# Patient Record
Sex: Male | Born: 1995 | Race: White | Hispanic: No | Marital: Single | Smoking: Current every day smoker
Health system: Southern US, Community
[De-identification: ages and names within clinical notes are randomized; demographics above are authoritative.]

## PROBLEM LIST (undated history)

## (undated) ENCOUNTER — Ambulatory Visit: Admission: EM | Payer: Worker's Compensation | Source: Home / Self Care

## (undated) DIAGNOSIS — J45909 Unspecified asthma, uncomplicated: Secondary | ICD-10-CM

## (undated) DIAGNOSIS — G589 Mononeuropathy, unspecified: Secondary | ICD-10-CM

## (undated) HISTORY — PX: SKIN GRAFT: SHX250

---
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
## 2013-01-27 ENCOUNTER — Emergency Department: Payer: Self-pay | Admitting: Emergency Medicine

## 2013-12-13 ENCOUNTER — Emergency Department: Payer: Self-pay | Admitting: Emergency Medicine

## 2014-02-04 ENCOUNTER — Emergency Department: Payer: Self-pay | Admitting: Emergency Medicine

## 2016-01-30 ENCOUNTER — Encounter: Payer: Self-pay | Admitting: Emergency Medicine

## 2016-01-30 ENCOUNTER — Emergency Department
Admission: EM | Admit: 2016-01-30 | Discharge: 2016-01-30 | Disposition: A | Discharge status: Home / Self Care | Payer: Self-pay | Attending: Student in an Organized Health Care Education/Training Program | Admitting: Student in an Organized Health Care Education/Training Program

## 2016-01-30 DIAGNOSIS — G8929 Other chronic pain: Secondary | ICD-10-CM | POA: Insufficient documentation

## 2016-01-30 DIAGNOSIS — F17200 Nicotine dependence, unspecified, uncomplicated: Secondary | ICD-10-CM | POA: Insufficient documentation

## 2016-01-30 DIAGNOSIS — K032 Erosion of teeth: Secondary | ICD-10-CM | POA: Insufficient documentation

## 2016-01-30 DIAGNOSIS — K0889 Other specified disorders of teeth and supporting structures: Secondary | ICD-10-CM

## 2016-01-30 DIAGNOSIS — K029 Dental caries, unspecified: Secondary | ICD-10-CM | POA: Insufficient documentation

## 2016-01-30 MED ORDER — MAGIC MOUTHWASH W/LIDOCAINE: 5.0000 mL | Freq: Four times a day (QID) | ORAL | 0 refills | Status: DC

## 2016-01-30 NOTE — ED Provider Notes (Signed)
[facility] Emergency Department Provider Note  ____________________________________________  Time seen: Approximately 8:19 PM  I have reviewed the triage vital signs and the nursing notes.   HISTORY  Chief Complaint Dental Pain    HPI Joshua PughJames C Gruetzmacher is a 20 y.o. male who presents to emergency department complaining of chronic left molar pain. Patient states that he has seen multiple dental clinics were unable to extract tooth. He saw a "tooth specialist"who told him it was going to be $900 to extract the tooth. Patient states that he does not have the money. Patient is concerned that he may have an infection in the site at this time. He denies any fevers or chills, difficulty breathing or swallowing.   History reviewed. No pertinent past medical history.  There are no active problems to display for this patient.   Past Surgical History:  Procedure Laterality Date  . SKIN GRAFT      Prior to Admission medications   Medication Sig Start Date End Date Taking? Authorizing Provider  magic mouthwash w/lidocaine SOLN Take 5 mLs by mouth 4 (four) times daily. 01/30/16   Delorise RoyalsJonathan D Cuthriell, PA-C    Allergies Review of patient's allergies indicates no known allergies.  No family history on file.  Social History Social History  Substance Use Topics  . Smoking status: Current Every Day Smoker  . Smokeless tobacco: Never Used  . Alcohol use No     Review of Systems  Constitutional: No fever/chills Eyes: No visual changes. No discharge ENT: Positive for left upper dental pain Cardiovascular: no chest pain. Respiratory: no cough. No SOB. Gastrointestinal: No abdominal pain.  No nausea, no vomiting.  Musculoskeletal: Negative for musculoskeletal pain. Skin: Negative for rash, abrasions, lacerations, ecchymosis. Neurological: Negative for headaches, focal weakness or numbness. 10-point ROS otherwise  negative.  ____________________________________________   PHYSICAL EXAM:  VITAL SIGNS: ED Triage Vitals  Enc Vitals Group     BP 01/30/16 2016 (!) 154/105     Pulse Rate 01/30/16 2016 99     Resp 01/30/16 2016 16     Temp 01/30/16 2016 98.2 F (36.8 C)     Temp Source 01/30/16 2016 Oral     SpO2 01/30/16 2016 100 %     Weight 01/30/16 2017 160 lb (72.6 kg)     Height 01/30/16 2017 5\' 6"  (1.676 m)     Head Circumference --      Peak Flow --      Pain Score 01/30/16 2017 5     Pain Loc --      Pain Edu? --      Excl. in GC? --      Constitutional: Alert and oriented. Well appearing and in no acute distress. Eyes: Conjunctivae are normal. PERRL. EOMI. Head: Atraumatic. ENT:      Ears:       Nose: No congestion/rhinnorhea.      Mouth/Throat: Mucous membranes are moist. Patient has poor dentition throughout. Tooth #14 has eroded into the pulp. No signs of infection. Area is nontender to palpation with tongue depressor. Neck: No stridor.   Hematological/Lymphatic/Immunilogical: No cervical lymphadenopathy. Cardiovascular: Normal rate, regular rhythm. Normal S1 and S2.  Good peripheral circulation. Respiratory: Normal respiratory effort without tachypnea or retractions. Lungs CTAB. Good air entry to the bases with no decreased or absent breath sounds. Musculoskeletal: Full range of motion to all extremities. No gross deformities appreciated. Neurologic:  Normal speech and language. No gross focal neurologic deficits are appreciated.  Skin:  Skin is warm, dry and intact. No rash noted. Psychiatric: Mood and affect are normal. Speech and behavior are normal. Patient exhibits appropriate insight and judgement.   ____________________________________________   LABS (all labs ordered are listed, but only abnormal results are displayed)  Labs Reviewed - No data to  display ____________________________________________  EKG   ____________________________________________  RADIOLOGY   No results found.  ____________________________________________    PROCEDURES  Procedure(s) performed:    Procedures    Medications - No data to display   ____________________________________________   INITIAL IMPRESSION / ASSESSMENT AND PLAN / ED COURSE  Pertinent labs & imaging results that were available during my care of the patient were reviewed by me and considered in my medical decision making (see chart for details).  Clinical Course    Patient's diagnosis is consistent with Dental pain. Patient has extensive dental erosions and cavities.. Patient will be discharged home with prescriptions for Magic mouthwash for symptom control. Patient is to follow up with [facility] dental clinic. Patient is given ED precautions to return to the ED for any worsening or new symptoms.     ____________________________________________  FINAL CLINICAL IMPRESSION(S) / ED DIAGNOSES  Final diagnoses:  Pain, dental  Dental caries  Dental erosion, generalized      NEW MEDICATIONS STARTED DURING THIS VISIT:  New Prescriptions   MAGIC MOUTHWASH W/LIDOCAINE SOLN    Take 5 mLs by mouth 4 (four) times daily.        This chart was dictated using voice recognition software/Dragon. Despite best efforts to proofread, errors can occur which can change the meaning. Any change was purely unintentional.    Racheal Patches, PA-C 01/30/16 2036    Willy Eddy, MD 01/30/16 2045

## 2016-01-30 NOTE — ED Triage Notes (Signed)
Per andrea, rn. Pt with left upper molar pain for greater than a year.

## 2016-03-18 ENCOUNTER — Emergency Department: Payer: No Typology Code available for payment source

## 2016-03-18 ENCOUNTER — Emergency Department
Admission: EM | Admit: 2016-03-18 | Discharge: 2016-03-18 | Disposition: A | Discharge status: Home / Self Care | Payer: No Typology Code available for payment source | Attending: Student | Admitting: Student

## 2016-03-18 ENCOUNTER — Encounter: Payer: Self-pay | Admitting: Emergency Medicine

## 2016-03-18 DIAGNOSIS — M546 Pain in thoracic spine: Secondary | ICD-10-CM

## 2016-03-18 DIAGNOSIS — Y9384 Activity, sleeping: Secondary | ICD-10-CM | POA: Diagnosis not present

## 2016-03-18 DIAGNOSIS — Y92410 Unspecified street and highway as the place of occurrence of the external cause: Secondary | ICD-10-CM | POA: Insufficient documentation

## 2016-03-18 DIAGNOSIS — J45909 Unspecified asthma, uncomplicated: Secondary | ICD-10-CM | POA: Insufficient documentation

## 2016-03-18 DIAGNOSIS — Y999 Unspecified external cause status: Secondary | ICD-10-CM | POA: Insufficient documentation

## 2016-03-18 DIAGNOSIS — S39012A Strain of muscle, fascia and tendon of lower back, initial encounter: Secondary | ICD-10-CM | POA: Diagnosis not present

## 2016-03-18 DIAGNOSIS — F17210 Nicotine dependence, cigarettes, uncomplicated: Secondary | ICD-10-CM | POA: Diagnosis not present

## 2016-03-18 DIAGNOSIS — M545 Low back pain: Secondary | ICD-10-CM | POA: Diagnosis present

## 2016-03-18 HISTORY — DX: Unspecified asthma, uncomplicated: J45.909

## 2016-03-18 HISTORY — DX: Mononeuropathy, unspecified: G58.9

## 2016-03-18 MED ORDER — IBUPROFEN 800 MG PO TABS: 800.0000 mg | ORAL_TABLET | Freq: Three times a day (TID) | ORAL | 0 refills | Status: DC | PRN

## 2016-03-18 MED ORDER — KETOROLAC TROMETHAMINE 60 MG/2ML IM SOLN
60.0000 mg | Freq: Once | INTRAMUSCULAR | Status: AC
  Administered 2016-03-18: 60 mg via INTRAMUSCULAR
  Filled 2016-03-18: qty 2

## 2016-03-18 MED ORDER — METHOCARBAMOL 750 MG PO TABS: 750.0000 mg | ORAL_TABLET | Freq: Four times a day (QID) | ORAL | 0 refills | Status: DC
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
## 2016-03-18 NOTE — ED Notes (Signed)
Pt verbalized understanding of discharge instructions. NAD at this time. 
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
## 2016-08-08 ENCOUNTER — Emergency Department
Admission: EM | Admit: 2016-08-08 | Discharge: 2016-08-08 | Disposition: A | Discharge status: Home / Self Care | Payer: Self-pay | Attending: Emergency Medicine | Admitting: Emergency Medicine

## 2016-08-08 ENCOUNTER — Encounter: Payer: Self-pay | Admitting: Medical Oncology

## 2016-08-08 DIAGNOSIS — J039 Acute tonsillitis, unspecified: Secondary | ICD-10-CM | POA: Insufficient documentation

## 2016-08-08 DIAGNOSIS — J45909 Unspecified asthma, uncomplicated: Secondary | ICD-10-CM | POA: Insufficient documentation

## 2016-08-08 DIAGNOSIS — F17210 Nicotine dependence, cigarettes, uncomplicated: Secondary | ICD-10-CM | POA: Insufficient documentation

## 2016-08-08 LAB — POCT RAPID STREP A: STREPTOCOCCUS, GROUP A SCREEN (DIRECT): NEGATIVE

## 2016-08-08 MED ORDER — LIDOCAINE VISCOUS 2 % MT SOLN: 5.0000 mL | Freq: Four times a day (QID) | OROMUCOSAL | 0 refills | Status: DC | PRN

## 2016-08-08 MED ORDER — AMOXICILLIN 500 MG PO CAPS: 500.0000 mg | ORAL_CAPSULE | Freq: Three times a day (TID) | ORAL | 0 refills | Status: DC

## 2016-08-08 MED ORDER — FIRST-DUKES MOUTHWASH MT SUSP: 10.0000 mL | Freq: Four times a day (QID) | OROMUCOSAL | 0 refills | Status: DC

## 2016-08-08 NOTE — ED Provider Notes (Signed)
[facility] Emergency Department Provider Note   ____________________________________________   First MD Initiated Contact with Patient 08/08/16 1211     (approximate)  I have reviewed the triage vital signs and the nursing notes.   HISTORY  Chief Complaint Fever and Sore Throat    HPI Joshua Riggs is a 21 y.o. male patient complaining of sore throat and fever began last night. Patient rates his pain as a 4/10.Patient states pain increases with swallowing but able to tolerate food and fluids. No palliative measures taken for this complaint.   Past Medical History:  Diagnosis Date  . Asthma   . Pinched nerve    back    There are no active problems to display for this patient.   Past Surgical History:  Procedure Laterality Date  . SKIN GRAFT      Prior to Admission medications   Medication Sig Start Date End Date Taking? Authorizing Provider  amoxicillin (AMOXIL) 500 MG capsule Take 1 capsule (500 mg total) by mouth 3 (three) times daily. 08/08/16   Joni Reiningonald K Smith, PA-C  Diphenhyd-Hydrocort-Nystatin (FIRST-DUKES MOUTHWASH) SUSP Use as directed 10 mLs in the mouth or throat 4 (four) times daily. 08/08/16   Joni Reiningonald K Smith, PA-C  ibuprofen (ADVIL,MOTRIN) 800 MG tablet Take 1 tablet (800 mg total) by mouth every 8 (eight) hours as needed. 03/18/16   Charmayne Sheerharles M Beers, PA-C  lidocaine (XYLOCAINE) 2 % solution Use as directed 5 mLs in the mouth or throat every 6 (six) hours as needed for mouth pain. Mixed with mouthwash swish and swallow. 08/08/16   Joni Reiningonald K Smith, PA-C  methocarbamol (ROBAXIN) 750 MG tablet Take 1 tablet (750 mg total) by mouth 4 (four) times daily. 03/18/16   Evangeline Dakinharles M Beers, PA-C    Allergies Patient has no known allergies.  No family history on file.  Social History Social History  Substance Use Topics  . Smoking status: Current Every Day Smoker    Packs/day: 1.00    Types: Cigarettes  . Smokeless tobacco: Never Used  .  Alcohol use Yes     Comment: occasionally    Review of Systems Constitutional: No fever/chills Eyes: No visual changes. ENT: No throat  Cardiovascular: Denies chest pain. Respiratory: Denies shortness of breath. Gastrointestinal: No abdominal pain.  No nausea, no vomiting.  No diarrhea.  No constipation. Genitourinary: Negative for dysuria. Musculoskeletal: Negative for back pain. Skin: Negative for rash. Neurological: Negative for headaches, focal weakness or numbness.    ____________________________________________   PHYSICAL EXAM:  VITAL SIGNS: ED Triage Vitals  Enc Vitals Group     BP 08/08/16 1136 121/90     Pulse Rate 08/08/16 1135 99     Resp 08/08/16 1135 18     Temp 08/08/16 1135 98.4 F (36.9 C)     Temp Source 08/08/16 1135 Oral     SpO2 08/08/16 1135 98 %     Weight 08/08/16 1135 160 lb (72.6 kg)     Height 08/08/16 1135 5\' 8"  (1.727 m)     Head Circumference --      Peak Flow --      Pain Score 08/08/16 1136 10     Pain Loc --      Pain Edu? --      Excl. in GC? --     Constitutional: Alert and oriented. Well appearing and in no acute distress. Eyes: Conjunctivae are normal. PERRL. EOMI. Head: Atraumatic. Nose: No congestion/rhinnorhea. Mouth/Throat: Mucous membranes are moist.  Oropharynx erythematous.Edematous tonsils. Neck: No stridor.  No cervical spine tenderness to palpation. Hematological/Lymphatic/Immunilogical: Bilateral cervical lymphadenopathy. Cardiovascular: Normal rate, regular rhythm. Grossly normal heart sounds.  Good peripheral circulation. Respiratory: Normal respiratory effort.  No retractions. Lungs CTAB. Gastrointestinal: Soft and nontender. No distention. No abdominal bruits. No CVA tenderness. Musculoskeletal: No lower extremity tenderness nor edema.  No joint effusions. Neurologic:  Normal speech and language. No gross focal neurologic deficits are appreciated. No gait instability. Skin:  Skin is warm, dry and intact. No rash  noted. Psychiatric: Mood and affect are normal. Speech and behavior are normal.  ____________________________________________   LABS (all labs ordered are listed, but only abnormal results are displayed)  Labs Reviewed  POCT RAPID STREP A   ____________________________________________  EKG   ____________________________________________  RADIOLOGY   ____________________________________________   PROCEDURES  Procedure(s) performed: None  Procedures  Critical Care performed: No  ____________________________________________   INITIAL IMPRESSION / ASSESSMENT AND PLAN / ED COURSE  Pertinent labs & imaging results that were available during my care of the patient were reviewed by me and considered in my medical decision making (see chart for details).  Tonsillitis. Patient given discharge care instructions. Patient given a prescription for amoxicillin, didn't mouthwash, and viscous lidocaine. Patient advised follow-up with "clinic if condition persists.      ____________________________________________   FINAL CLINICAL IMPRESSION(S) / ED DIAGNOSES  Final diagnoses:  Tonsillitis with exudate      NEW MEDICATIONS STARTED DURING THIS VISIT:  Discharge Medication List as of 08/08/2016  1:09 PM    START taking these medications   Details  amoxicillin (AMOXIL) 500 MG capsule Take 1 capsule (500 mg total) by mouth 3 (three) times daily., Starting Tue 08/08/2016, Print    Diphenhyd-Hydrocort-Nystatin (FIRST-DUKES MOUTHWASH) SUSP Use as directed 10 mLs in the mouth or throat 4 (four) times daily., Starting Tue 08/08/2016, Print    lidocaine (XYLOCAINE) 2 % solution Use as directed 5 mLs in the mouth or throat every 6 (six) hours as needed for mouth pain. Mixed with mouthwash swish and swallow., Starting Tue 08/08/2016, Print         Note:  This document was prepared using Dragon voice recognition software and may include unintentional dictation errors.    Joni Reining, PA-C 08/09/16 1233    Phineas Semen, MD 08/09/16 1247

## 2016-08-08 NOTE — ED Notes (Signed)
See triage note  Fever and sore throat which started yesterday  Afebrile on arrival

## 2016-08-08 NOTE — ED Notes (Signed)
Pt alert and oriented X4, active, cooperative, pt in NAD. RR even and unlabored, color WNL.  Pt informed to return if any life threatening symptoms occur.   

## 2016-08-08 NOTE — ED Triage Notes (Signed)
Pt reports sore throat and fever that began last night.  

## 2016-11-05 ENCOUNTER — Encounter: Payer: Self-pay | Admitting: Emergency Medicine

## 2016-11-05 ENCOUNTER — Emergency Department
Admission: EM | Admit: 2016-11-05 | Discharge: 2016-11-05 | Disposition: A | Discharge status: Home / Self Care | Payer: Self-pay | Attending: Emergency Medicine | Admitting: Emergency Medicine

## 2016-11-05 DIAGNOSIS — X58XXXA Exposure to other specified factors, initial encounter: Secondary | ICD-10-CM | POA: Insufficient documentation

## 2016-11-05 DIAGNOSIS — Y929 Unspecified place or not applicable: Secondary | ICD-10-CM | POA: Insufficient documentation

## 2016-11-05 DIAGNOSIS — F17210 Nicotine dependence, cigarettes, uncomplicated: Secondary | ICD-10-CM | POA: Insufficient documentation

## 2016-11-05 DIAGNOSIS — S93602A Unspecified sprain of left foot, initial encounter: Secondary | ICD-10-CM | POA: Insufficient documentation

## 2016-11-05 DIAGNOSIS — Y999 Unspecified external cause status: Secondary | ICD-10-CM | POA: Insufficient documentation

## 2016-11-05 DIAGNOSIS — Y939 Activity, unspecified: Secondary | ICD-10-CM | POA: Insufficient documentation

## 2016-11-05 DIAGNOSIS — J45909 Unspecified asthma, uncomplicated: Secondary | ICD-10-CM | POA: Insufficient documentation

## 2016-11-05 NOTE — ED Provider Notes (Signed)
[facility] Emergency Department Provider Note   ____________________________________________   First MD Initiated Contact with Patient 11/05/16 1620     (approximate)  I have reviewed the triage vital signs and the nursing notes.   HISTORY  Chief Complaint Ankle Injury    HPI Joshua Riggs is a 21 y.o. male patient complaining of dorsalleft foot pain for 2 days. Patient state unsure how he injured his foot/ankle but believe happened when he was moving something. Patient stated he was sent home from work on Thursday and told to have a return to work evaluation. Patient rates his foot /ankle pain as a 3/10. Patient described a pain as mild aching. Patient is able to bear weight. No palliative measures for complaint.   Past Medical History:  Diagnosis Date  . Asthma   . Pinched nerve    back    There are no active problems to display for this patient.   Past Surgical History:  Procedure Laterality Date  . SKIN GRAFT      Prior to Admission medications   Medication Sig Start Date End Date Taking? Authorizing Provider  amoxicillin (AMOXIL) 500 MG capsule Take 1 capsule (500 mg total) by mouth 3 (three) times daily. 08/08/16   Joni Reining, PA-C  Diphenhyd-Hydrocort-Nystatin (FIRST-DUKES MOUTHWASH) SUSP Use as directed 10 mLs in the mouth or throat 4 (four) times daily. 08/08/16   Joni Reining, PA-C  ibuprofen (ADVIL,MOTRIN) 800 MG tablet Take 1 tablet (800 mg total) by mouth every 8 (eight) hours as needed. 03/18/16   Beers, Charmayne Sheer, PA-C  lidocaine (XYLOCAINE) 2 % solution Use as directed 5 mLs in the mouth or throat every 6 (six) hours as needed for mouth pain. Mixed with mouthwash swish and swallow. 08/08/16   Joni Reining, PA-C  methocarbamol (ROBAXIN) 750 MG tablet Take 1 tablet (750 mg total) by mouth 4 (four) times daily. 03/18/16   Beers, Charmayne Sheer, PA-C    Allergies Patient has no known allergies.  No family history on  file.  Social History Social History  Substance Use Topics  . Smoking status: Current Every Day Smoker    Packs/day: 1.00    Types: Cigarettes  . Smokeless tobacco: Never Used  . Alcohol use Yes     Comment: occasionally    Review of Systems  Constitutional: No fever/chills Cardiovascular: Denies chest pain. Respiratory: Denies shortness of breath. Musculoskeletal:left dorsal foot pain. Skin: Negative for rash. Neurological: Negative for headaches, focal weakness or numbness.   ____________________________________________   PHYSICAL EXAM:  VITAL SIGNS: ED Triage Vitals  Enc Vitals Group     BP 11/05/16 1546 117/77     Pulse Rate 11/05/16 1546 88     Resp 11/05/16 1546 16     Temp 11/05/16 1546 98.1 F (36.7 C)     Temp Source 11/05/16 1546 Oral     SpO2 11/05/16 1546 99 %     Weight 11/05/16 1543 160 lb (72.6 kg)     Height 11/05/16 1543 5\' 7"  (1.702 m)     Head Circumference --      Peak Flow --      Pain Score 11/05/16 1543 3     Pain Loc --      Pain Edu? --      Excl. in GC? --     Constitutional: Alert and oriented. Well appearing and in no acute distress. Cardiovascular: Normal rate, regular rhythm. Grossly normal heart sounds.  Good  peripheral circulation. Respiratory: Normal respiratory effort.  No retractions. Lungs CTAB. Musculoskeletal: no obvious deformity edema or ecchymosis to the left foot. Patient has mild guarding palpation dorsal aspect of mid foot. Patient has normal gait. Neurologic:  Normal speech and language. No gross focal neurologic deficits are appreciated. No gait instability. Skin:  Skin is warm, dry and intact. No rash noted. Psychiatric: Mood and affect are normal. Speech and behavior are normal.  ____________________________________________   LABS (all labs ordered are listed, but only abnormal results are displayed)  Labs Reviewed - No data to  display ____________________________________________  EKG   ____________________________________________  RADIOLOGY   ____________________________________________   PROCEDURES  Procedure(s) performed: None  Procedures  Critical Care performed: No  ____________________________________________   INITIAL IMPRESSION / ASSESSMENT AND PLAN / ED COURSE  Pertinent labs & imaging results that were available during my care of the patient were reviewed by me and considered in my medical decision making (see chart for details).   left foot sprain. Patient given discharge care instructions and advised to follow "clinic if condition persists.      ____________________________________________   FINAL CLINICAL IMPRESSION(S) / ED DIAGNOSES  Final diagnoses:  Foot sprain, left, initial encounter      NEW MEDICATIONS STARTED DURING THIS VISIT:  New Prescriptions   No medications on file     Note:  This document was prepared using Dragon voice recognition software and may include unintentional dictation errors.    Joni ReiningSmith, Dagen Beevers K, PA-C 11/05/16 1627    Schaevitz, Myra Rudeavid Matthew, MD 11/05/16 [phone]

## 2016-11-05 NOTE — ED Notes (Signed)
Patient sprained his ankle last week and has been using RICE at home.  He presents with no swelling or ecchymosis.  He is able to fully bear weight on the "affected ankle" by doing lunges with no pain complaint or grimacing during effort.  He says he "is afraid when he goes back to work it will start to hurt"

## 2016-11-05 NOTE — ED Triage Notes (Signed)
States he injured ankle on Thursday "Doing something" and was sent home from work.  Here today to get a return to work note.

## 2017-05-17 ENCOUNTER — Emergency Department
Admission: EM | Admit: 2017-05-17 | Discharge: 2017-05-17 | Disposition: A | Discharge status: Home / Self Care | Payer: Self-pay | Attending: Emergency Medicine | Admitting: Emergency Medicine

## 2017-05-17 ENCOUNTER — Encounter: Payer: Self-pay | Admitting: Intensive Care

## 2017-05-17 DIAGNOSIS — F17210 Nicotine dependence, cigarettes, uncomplicated: Secondary | ICD-10-CM | POA: Insufficient documentation

## 2017-05-17 DIAGNOSIS — J039 Acute tonsillitis, unspecified: Secondary | ICD-10-CM | POA: Insufficient documentation

## 2017-05-17 DIAGNOSIS — J45909 Unspecified asthma, uncomplicated: Secondary | ICD-10-CM | POA: Insufficient documentation

## 2017-05-17 LAB — POCT RAPID STREP A: Streptococcus, Group A Screen (Direct): NEGATIVE

## 2017-05-17 MED ORDER — LIDOCAINE VISCOUS 2 % MT SOLN: 10.0000 mL | OROMUCOSAL | 0 refills | Status: DC | PRN

## 2017-05-17 MED ORDER — DEXAMETHASONE SODIUM PHOSPHATE 10 MG/ML IJ SOLN
10.0000 mg | Freq: Once | INTRAMUSCULAR | Status: AC
  Administered 2017-05-17: 10 mg via INTRAMUSCULAR
  Filled 2017-05-17: qty 1

## 2017-05-17 MED ORDER — AMOXICILLIN 500 MG PO CAPS: 500.0000 mg | ORAL_CAPSULE | Freq: Two times a day (BID) | ORAL | 0 refills | Status: AC

## 2017-05-17 NOTE — ED Notes (Signed)

## 2017-05-17 NOTE — ED Triage Notes (Signed)
Patient reports he has had a sore throat X2 days and has noticeable white spots on tonsils. HX strep. Reports no fever today

## 2017-05-17 NOTE — ED Provider Notes (Signed)
[facility] Emergency Department Provider Note  ____________________________________________  Time seen: Approximately 7:06 PM  I have reviewed the triage vital signs and the nursing notes.   HISTORY  Chief Complaint Sore Throat    HPI Joshua Riggs is a 21 y.o. male that presents to the emergency department for evaluation of sore throat for 2 days.  Patient states that he gets strep throat frequently and this feels the same.  He also has nasal congestion and can only breathe through one nostril.  He thinks that he has had a fever on and off but has not checked his temperature.  No sick contacts.  He states that he has not taken any medication for symptoms because he was waiting for the emergency department to give him medicines.  No cough, shortness breath, nausea, vomiting, abdominal pain.  Past Medical History:  Diagnosis Date  . Asthma   . Pinched nerve    back    There are no active problems to display for this patient.   Past Surgical History:  Procedure Laterality Date  . SKIN GRAFT      Prior to Admission medications   Medication Sig Start Date End Date Taking? Authorizing Provider  amoxicillin (AMOXIL) 500 MG capsule Take 1 capsule (500 mg total) by mouth 2 (two) times daily for 10 days. 05/17/17 05/27/17  Enid DerryWagner, Houa Nie, PA-C  Diphenhyd-Hydrocort-Nystatin (FIRST-DUKES MOUTHWASH) SUSP Use as directed 10 mLs in the mouth or throat 4 (four) times daily. 08/08/16   Joni ReiningSmith, Ronald K, PA-C  ibuprofen (ADVIL,MOTRIN) 800 MG tablet Take 1 tablet (800 mg total) by mouth every 8 (eight) hours as needed. 03/18/16   Beers, Charmayne Sheerharles M, PA-C  lidocaine (XYLOCAINE) 2 % solution Use as directed 10 mLs in the mouth or throat as needed for mouth pain. 05/17/17   Enid DerryWagner, Tommie Bohlken, PA-C  methocarbamol (ROBAXIN) 750 MG tablet Take 1 tablet (750 mg total) by mouth 4 (four) times daily. 03/18/16   Beers, Charmayne Sheerharles M, PA-C    Allergies Patient has no known  allergies.  History reviewed. No pertinent family history.  Social History Social History   Tobacco Use  . Smoking status: Current Every Day Smoker    Packs/day: 1.00    Types: Cigarettes  . Smokeless tobacco: Never Used  Substance Use Topics  . Alcohol use: Yes    Comment: occasionally  . Drug use: No     Review of Systems  Eyes: No visual changes. No discharge. ENT: Positive for congestion and rhinorrhea. Cardiovascular: No chest pain. Respiratory: Negative for cough. No SOB. Gastrointestinal: No abdominal pain.  No nausea, no vomiting.  No diarrhea.  No constipation. Musculoskeletal: Negative for musculoskeletal pain. Skin: Negative for rash, abrasions, lacerations, ecchymosis. Neurological: Negative for headaches.   ____________________________________________   PHYSICAL EXAM:  VITAL SIGNS: ED Triage Vitals  Enc Vitals Group     BP 05/17/17 1806 137/82     Pulse Rate 05/17/17 1806 71     Resp 05/17/17 1806 16     Temp 05/17/17 1806 (!) 97.5 F (36.4 C)     Temp Source 05/17/17 1806 Oral     SpO2 05/17/17 1806 98 %     Weight 05/17/17 1807 145 lb (65.8 kg)     Height 05/17/17 1807 5\' 6"  (1.676 m)     Head Circumference --      Peak Flow --      Pain Score --      Pain Loc --  Pain Edu? --      Excl. in GC? --      Constitutional: Alert and oriented. Well appearing and in no acute distress. Eyes: Conjunctivae are normal. PERRL. EOMI. No discharge. Head: Atraumatic. ENT: No frontal and maxillary sinus tenderness.      Ears: Tympanic membranes pearly gray with good landmarks. No discharge.      Nose: Mild congestion/rhinnorhea.      Mouth/Throat: Mucous membranes are moist. Oropharynx erythematous. Tonsils enlarged 2+ bilaterally with exudates. Uvula midline. Neck: No stridor.   Hematological/Lymphatic/Immunilogical: No cervical lymphadenopathy. Cardiovascular: Normal rate, regular rhythm.  Good peripheral circulation. Respiratory: Normal  respiratory effort without tachypnea or retractions. Lungs CTAB. Good air entry to the bases with no decreased or absent breath sounds. Gastrointestinal: Bowel sounds 4 quadrants. Soft and nontender to palpation. No guarding or rigidity. No palpable masses. No distention. Musculoskeletal: Full range of motion to all extremities. No gross deformities appreciated. Neurologic:  Normal speech and language. No gross focal neurologic deficits are appreciated.  Skin:  Skin is warm, dry and intact. No rash noted.  ____________________________________________   LABS (all labs ordered are listed, but only abnormal results are displayed)  Labs Reviewed  POCT RAPID STREP A   ____________________________________________  EKG   ____________________________________________  RADIOLOGY   No results found.  ____________________________________________    PROCEDURES  Procedure(s) performed:    Procedures    Medications  dexamethasone (DECADRON) injection 10 mg (10 mg Intramuscular Given 05/17/17 1908)     ____________________________________________   INITIAL IMPRESSION / ASSESSMENT AND PLAN / ED COURSE  Pertinent labs & imaging results that were available during my care of the patient were reviewed by me and considered in my medical decision making (see chart for details).  Review of the [place] CSRS was performed in accordance of the [facility] prior to dispensing any controlled drugs.   Patient's diagnosis is consistent with tonsillitis. Vital signs and exam are reassuring. Patient appears well and is staying well hydrated. Patient feels comfortable going home. He was given IM decadron in ED. Patient will be discharged home with prescriptions for amoxicillin and viscous lidocaine. Patient is to follow up with PCP as needed or otherwise directed. Patient is given ED precautions to return to the ED for any worsening or new  symptoms.     ____________________________________________  FINAL CLINICAL IMPRESSION(S) / ED DIAGNOSES  Final diagnoses:  Tonsillitis          This chart was dictated using voice recognition software/Dragon. Despite best efforts to proofread, errors can occur which can change the meaning. Any change was purely unintentional.   Enid DerryWagner, Audrey Eller, PA-C 05/17/17 2106  Arnaldo NatalMalinda, Paul F, MD 05/17/17 2337

## 2017-10-01 ENCOUNTER — Emergency Department
Admission: EM | Admit: 2017-10-01 | Discharge: 2017-10-01 | Disposition: A | Discharge status: Home / Self Care | Payer: Self-pay | Attending: Emergency Medicine | Admitting: Emergency Medicine

## 2017-10-01 ENCOUNTER — Encounter: Payer: Self-pay | Admitting: Emergency Medicine

## 2017-10-01 ENCOUNTER — Emergency Department: Payer: Self-pay

## 2017-10-01 DIAGNOSIS — F17210 Nicotine dependence, cigarettes, uncomplicated: Secondary | ICD-10-CM | POA: Insufficient documentation

## 2017-10-01 DIAGNOSIS — J45909 Unspecified asthma, uncomplicated: Secondary | ICD-10-CM | POA: Insufficient documentation

## 2017-10-01 DIAGNOSIS — J069 Acute upper respiratory infection, unspecified: Secondary | ICD-10-CM | POA: Insufficient documentation

## 2017-10-01 MED ORDER — AMOXICILLIN 500 MG PO CAPS: 500.0000 mg | ORAL_CAPSULE | Freq: Three times a day (TID) | ORAL | 0 refills | Status: DC

## 2017-10-01 MED ORDER — BENZONATATE 200 MG PO CAPS: 200.0000 mg | ORAL_CAPSULE | Freq: Three times a day (TID) | ORAL | 0 refills | Status: AC | PRN

## 2017-10-01 NOTE — ED Triage Notes (Signed)
Pt reports his brother was diagnosed with bronchitis and he drank after him and now with nasal congestion, coughing and sore throat.

## 2017-10-01 NOTE — ED Notes (Signed)
Pt verbalizes understanding of d/c instructions, medications and follow up 

## 2017-10-01 NOTE — ED Provider Notes (Signed)
[facility] Emergency Department Provider Note  ____________________________________________   First MD Initiated Contact with Patient 10/01/17 [phone]     (approximate)  I have reviewed the triage vital signs and the nursing notes.   HISTORY  Chief Complaint Nasal Congestion; Sore Throat; and Cough    HPI Joshua Riggs is a 22 y.o. male presents emergency department complaining of cough and congestion with fever for 2 days.  He states his brother was diagnosed with bronchitis last week and had same symptoms.  He denies any vomiting or diarrhea.  Denies chest pain or shortness of breath.  Past Medical History:  Diagnosis Date  . Asthma   . Pinched nerve    back    There are no active problems to display for this patient.   Past Surgical History:  Procedure Laterality Date  . SKIN GRAFT      Prior to Admission medications   Medication Sig Start Date End Date Taking? Authorizing Provider  amoxicillin (AMOXIL) 500 MG capsule Take 1 capsule (500 mg total) by mouth 3 (three) times daily. 10/01/17   Fisher, Roselyn Bering, PA-C  benzonatate (TESSALON) 200 MG capsule Take 1 capsule (200 mg total) by mouth 3 (three) times daily as needed for cough. 10/01/17   Faythe Ghee, PA-C    Allergies Patient has no known allergies.  No family history on file.  Social History Social History   Tobacco Use  . Smoking status: Current Every Day Smoker    Packs/day: 1.00    Types: Cigarettes  . Smokeless tobacco: Never Used  Substance Use Topics  . Alcohol use: Yes    Comment: occasionally  . Drug use: No    Review of Systems  Constitutional: Positive fever/chills Eyes: No visual changes. ENT: No sore throat. Respiratory:  positive cough Genitourinary: Negative for dysuria. Musculoskeletal: Negative for back pain. Skin: Negative for rash.    ____________________________________________   PHYSICAL EXAM:  VITAL SIGNS: ED Triage Vitals  Enc Vitals  Group     BP 10/01/17 0900 122/84     Pulse Rate 10/01/17 0900 (!) 108     Resp 10/01/17 0900 20     Temp 10/01/17 0900 98.3 F (36.8 C)     Temp Source 10/01/17 0900 Oral     SpO2 10/01/17 0900 99 %     Weight 10/01/17 0901 140 lb (63.5 kg)     Height 10/01/17 0901 5\' 7"  (1.702 m)     Head Circumference --      Peak Flow --      Pain Score 10/01/17 0901 10     Pain Loc --      Pain Edu? --      Excl. in GC? --     Constitutional: Alert and oriented. Well appearing and in no acute distress. Eyes: Conjunctivae are normal.  Head: Atraumatic. Nose: No congestion/rhinnorhea. Mouth/Throat: Mucous membranes are moist.  Throat is red and irritated Neck: Is supple, no lymphadenopathy is noted Cardiovascular: Normal rate, regular rhythm.  Heart sounds are normal Respiratory: Normal respiratory effort.  No retractions, lungs clear to auscultation GU: deferred Musculoskeletal: FROM all extremities, warm and well perfused Neurologic:  Normal speech and language.  Skin:  Skin is warm, dry and intact. No rash noted. Psychiatric: Mood and affect are normal. Speech and behavior are normal.  ____________________________________________   LABS (all labs ordered are listed, but only abnormal results are displayed)  Labs Reviewed - No data to display ____________________________________________  ____________________________________________  RADIOLOGY  Chest x-ray is negative for any acute abnormality  ____________________________________________   PROCEDURES  Procedure(s) performed: No  Procedures    ____________________________________________   INITIAL IMPRESSION / ASSESSMENT AND PLAN / ED COURSE  Pertinent labs & imaging results that were available during my care of the patient were reviewed by me and considered in my medical decision making (see chart for details).  Patient is 22 year old male presents to the emergency department with flulike symptoms.  On  physical exam he appears well.  Lungs clear to auscultation.  There is no other findings other than his throat is red and irritated.  His cough is dry and hacking  Chest x-ray is negative for any acute abnormality  Chest diagnoses acute upper respiratory infection.  He was given a prescription for amoxicillin 500 mg 3 times a day for 10 days.  Tessalon Perles 200 mg 3 times a day for cough.  He is to drink plenty of fluids.  Take Tylenol and ibuprofen.  He was given a work note for today and tomorrow as he was to start a new job today.  He was discharged in stable condition.     As part of my medical decision making, I reviewed the following data within the electronic MEDICAL RECORD NUMBER Nursing notes reviewed and incorporated, Old chart reviewed, Radiograph reviewed chest x-ray is negative for any acute abnormality, Notes from prior ED visits and [place] Controlled Substance Database  ____________________________________________   FINAL CLINICAL IMPRESSION(S) / ED DIAGNOSES  Final diagnoses:  Acute upper respiratory infection      NEW MEDICATIONS STARTED DURING THIS VISIT:  Discharge Medication List as of 10/01/2017 10:12 AM    START taking these medications   Details  amoxicillin (AMOXIL) 500 MG capsule Take 1 capsule (500 mg total) by mouth 3 (three) times daily., Starting Mon 10/01/2017, Print    benzonatate (TESSALON) 200 MG capsule Take 1 capsule (200 mg total) by mouth 3 (three) times daily as needed for cough., Starting Mon 10/01/2017, Print         Note:  This document was prepared using Dragon voice recognition software and may include unintentional dictation errors.    Faythe GheeFisher, Susan W, PA-C 10/01/17 1858    Schaevitz, Myra Rudeavid Matthew, MD 10/02/17 [id number]

## 2017-10-01 NOTE — Discharge Instructions (Addendum)
Follow-up with your regular doctor if not better in 3-5 days.  Use medication as prescribed.  If you are becoming worsening return to the emergency department or follow-up the acute care.

## 2017-10-01 NOTE — ED Notes (Signed)
See triage note  States he developed body achess,fever and cough 2 days ago  Fever was subjective at home   Afebrile on arrival

## 2017-11-14 ENCOUNTER — Emergency Department: Payer: Self-pay

## 2017-11-14 ENCOUNTER — Encounter: Payer: Self-pay | Admitting: Emergency Medicine

## 2017-11-14 ENCOUNTER — Emergency Department
Admission: EM | Admit: 2017-11-14 | Discharge: 2017-11-15 | Disposition: A | Discharge status: Home / Self Care | Payer: Self-pay | Attending: Emergency Medicine | Admitting: Emergency Medicine

## 2017-11-14 DIAGNOSIS — Z5321 Procedure and treatment not carried out due to patient leaving prior to being seen by health care provider: Secondary | ICD-10-CM | POA: Insufficient documentation

## 2017-11-14 DIAGNOSIS — M25532 Pain in left wrist: Secondary | ICD-10-CM | POA: Insufficient documentation

## 2017-11-14 IMAGING — CR DG WRIST COMPLETE 3+V*L*
1 series · 4 of 4 positions shown · non-contrast
Comparison: None.

CLINICAL DATA: Acute onset of left wrist pain.  Initial encounter.

EXAM:
LEFT WRIST - COMPLETE 3+ VIEW

[Series 1: dg wrist complete left · 0.14mm/px · 4 of 4 slices shown]
[im 1/4]
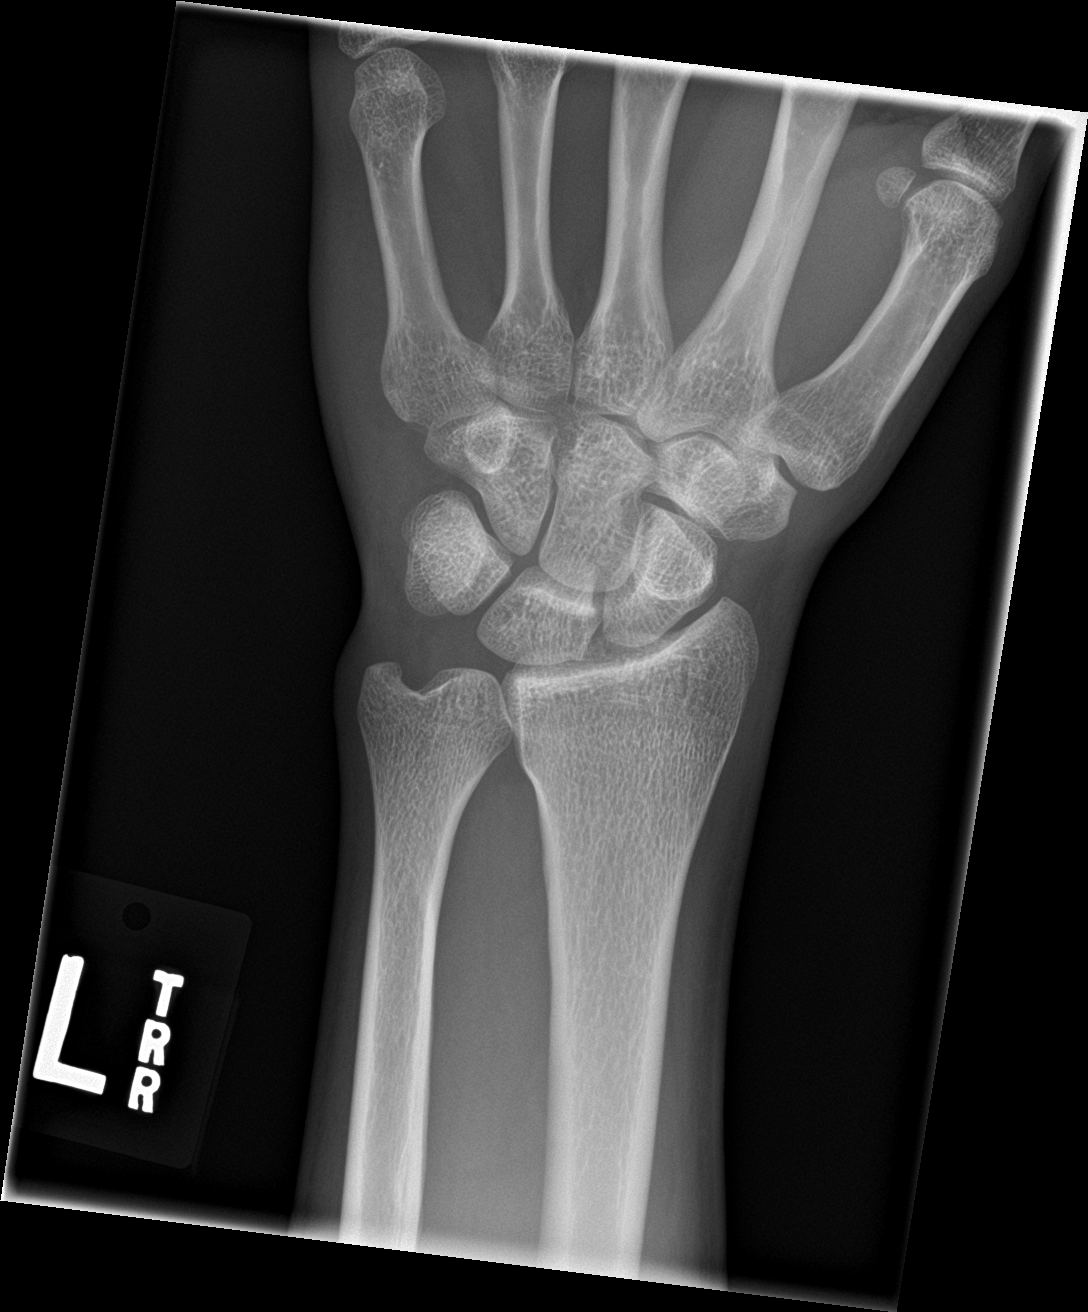
[im 2/4]
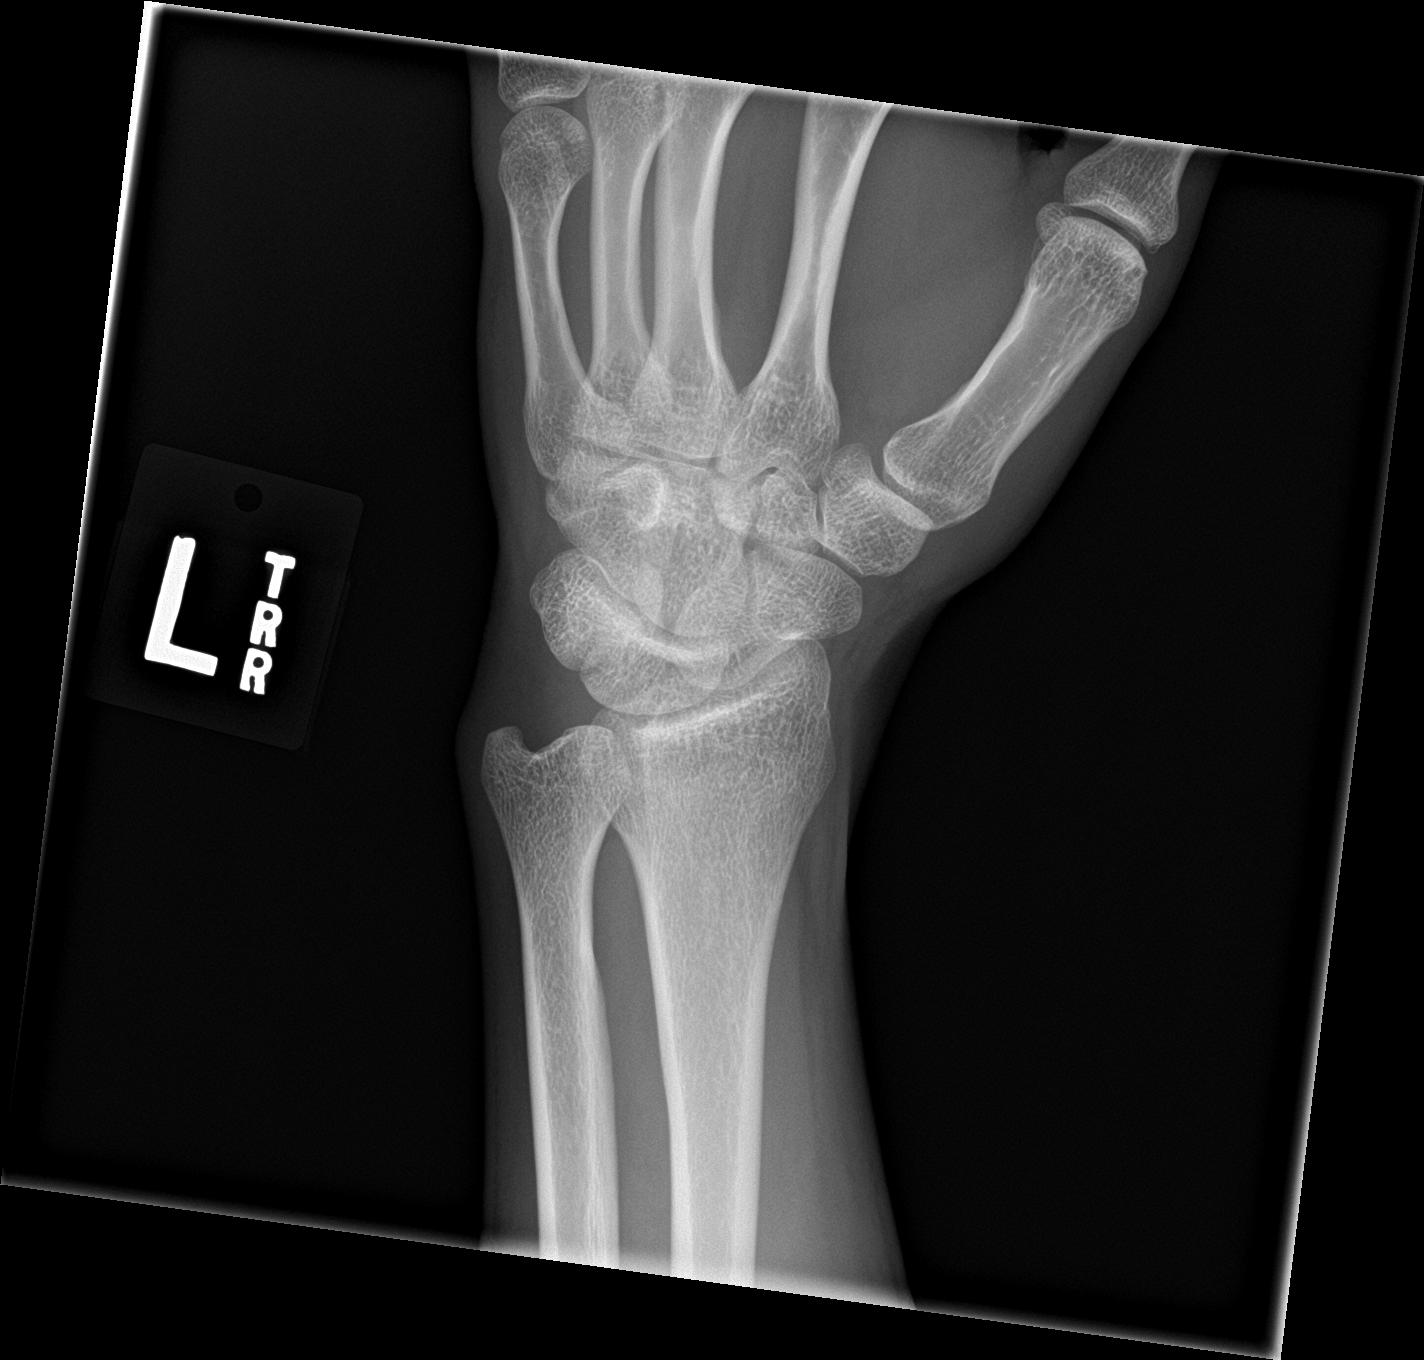
[im 3/4]
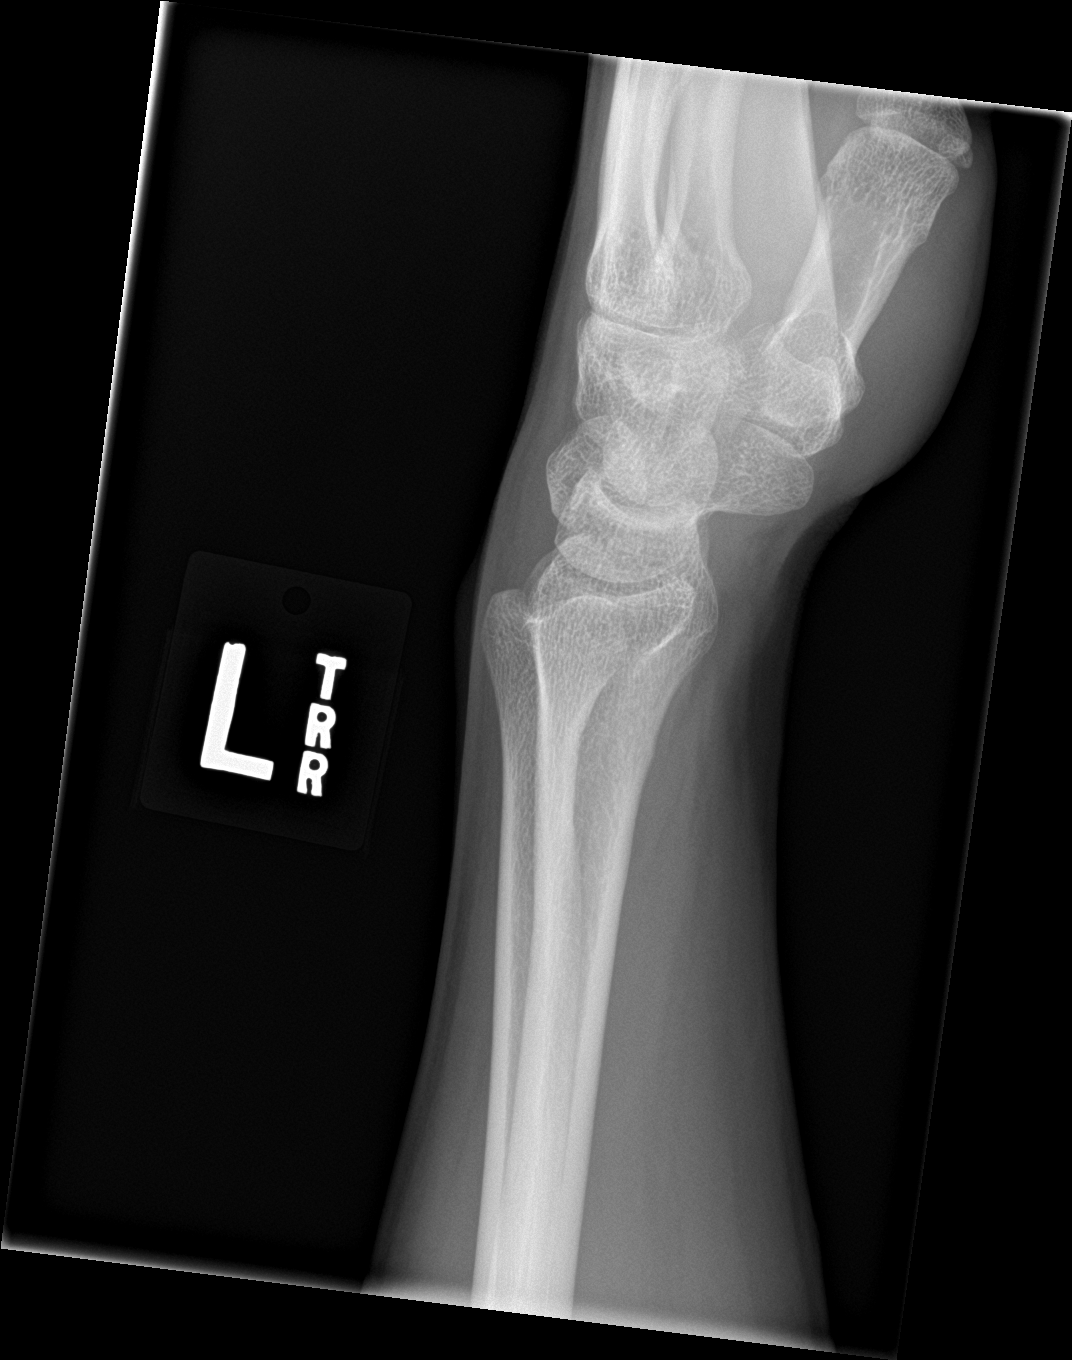
[im 4/4]
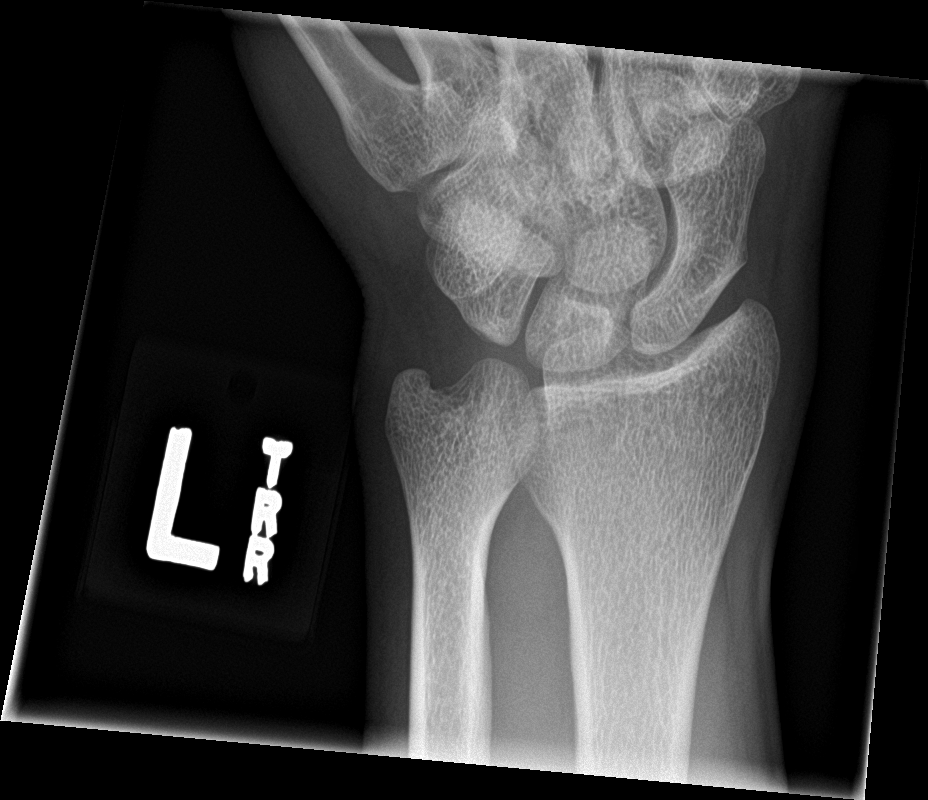

[4 of 4 positions shown; findings below may reference images not displayed]

FINDINGS: There is no evidence of fracture or dislocation. The carpal rows are
intact, and demonstrate normal alignment. The joint spaces are
preserved.

No significant soft tissue abnormalities are seen.
IMPRESSION: No evidence of fracture or dislocation.

## 2017-11-14 NOTE — ED Notes (Signed)
Patient called to be roomed x1.  No answer at this time.

## 2017-11-14 NOTE — ED Triage Notes (Signed)
Pt comes into the eD via POV c/o left wrist pain.  Patient denies any known injury to the wrist.  No deformity noted at this time and patient in NAD.

## 2017-11-14 NOTE — ED Notes (Signed)
Patient called to be roomed x2.  No answer at this time.

## 2018-02-12 ENCOUNTER — Encounter: Payer: Self-pay | Admitting: Emergency Medicine

## 2018-02-12 ENCOUNTER — Other Ambulatory Visit: Payer: Self-pay

## 2018-02-12 ENCOUNTER — Emergency Department
Admission: EM | Admit: 2018-02-12 | Discharge: 2018-02-12 | Disposition: A | Discharge status: Home / Self Care | Payer: Self-pay | Attending: Emergency Medicine | Admitting: Emergency Medicine

## 2018-02-12 DIAGNOSIS — K0889 Other specified disorders of teeth and supporting structures: Secondary | ICD-10-CM | POA: Insufficient documentation

## 2018-02-12 DIAGNOSIS — Z5321 Procedure and treatment not carried out due to patient leaving prior to being seen by health care provider: Secondary | ICD-10-CM | POA: Insufficient documentation

## 2018-02-12 NOTE — ED Notes (Signed)
Went to assess pt, pt not found in room. Registration overheard pt yelling on phone about the wait.

## 2018-02-12 NOTE — ED Triage Notes (Signed)
Patient to ER for c/o dental pain to left front upper tooth for last few days.

## 2019-05-16 ENCOUNTER — Emergency Department
Admission: EM | Admit: 2019-05-16 | Discharge: 2019-05-16 | Disposition: A | Discharge status: Home / Self Care | Payer: No Typology Code available for payment source | Attending: Emergency Medicine | Admitting: Emergency Medicine

## 2019-05-16 ENCOUNTER — Other Ambulatory Visit: Payer: Self-pay

## 2019-05-16 ENCOUNTER — Encounter: Payer: Self-pay | Admitting: Emergency Medicine

## 2019-05-16 ENCOUNTER — Emergency Department: Payer: No Typology Code available for payment source

## 2019-05-16 DIAGNOSIS — S161XXA Strain of muscle, fascia and tendon at neck level, initial encounter: Secondary | ICD-10-CM | POA: Diagnosis not present

## 2019-05-16 DIAGNOSIS — M25512 Pain in left shoulder: Secondary | ICD-10-CM | POA: Diagnosis not present

## 2019-05-16 DIAGNOSIS — F17210 Nicotine dependence, cigarettes, uncomplicated: Secondary | ICD-10-CM | POA: Insufficient documentation

## 2019-05-16 DIAGNOSIS — S199XXA Unspecified injury of neck, initial encounter: Secondary | ICD-10-CM | POA: Diagnosis present

## 2019-05-16 DIAGNOSIS — J45909 Unspecified asthma, uncomplicated: Secondary | ICD-10-CM | POA: Diagnosis not present

## 2019-05-16 DIAGNOSIS — Y998 Other external cause status: Secondary | ICD-10-CM | POA: Diagnosis not present

## 2019-05-16 DIAGNOSIS — Y9389 Activity, other specified: Secondary | ICD-10-CM | POA: Diagnosis not present

## 2019-05-16 DIAGNOSIS — Y92410 Unspecified street and highway as the place of occurrence of the external cause: Secondary | ICD-10-CM | POA: Insufficient documentation

## 2019-05-16 MED ORDER — NAPROXEN 500 MG PO TABS: 500.0000 mg | ORAL_TABLET | Freq: Two times a day (BID) | ORAL | 0 refills | Status: DC

## 2019-05-16 MED ORDER — CYCLOBENZAPRINE HCL 10 MG PO TABS: 10.0000 mg | ORAL_TABLET | Freq: Three times a day (TID) | ORAL | 0 refills | Status: AC | PRN
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
## 2019-05-16 NOTE — Discharge Instructions (Signed)
Please follow-up with your primary care provider or [facility] acute care for symptoms that are not improving over the week.  Return to the emergency department for symptoms that change or worsen if you are unable to schedule an appointment.  Do not drive if you are taking the muscle relaxer.
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
## 2019-06-06 ENCOUNTER — Emergency Department
Admission: EM | Admit: 2019-06-06 | Discharge: 2019-06-07 | Discharge status: Against Medical Advice | Payer: Self-pay | Attending: Emergency Medicine | Admitting: Emergency Medicine

## 2019-06-06 ENCOUNTER — Other Ambulatory Visit: Payer: Self-pay

## 2019-06-06 ENCOUNTER — Emergency Department
Admission: EM | Admit: 2019-06-06 | Discharge: 2019-06-06 | Disposition: A | Discharge status: Home / Self Care | Payer: Self-pay

## 2019-06-06 ENCOUNTER — Encounter: Payer: Self-pay | Admitting: Emergency Medicine

## 2019-06-06 ENCOUNTER — Emergency Department: Payer: Self-pay

## 2019-06-06 DIAGNOSIS — F17210 Nicotine dependence, cigarettes, uncomplicated: Secondary | ICD-10-CM | POA: Insufficient documentation

## 2019-06-06 DIAGNOSIS — R220 Localized swelling, mass and lump, head: Secondary | ICD-10-CM

## 2019-06-06 DIAGNOSIS — J45909 Unspecified asthma, uncomplicated: Secondary | ICD-10-CM | POA: Insufficient documentation

## 2019-06-06 DIAGNOSIS — K047 Periapical abscess without sinus: Secondary | ICD-10-CM | POA: Insufficient documentation

## 2019-06-06 DIAGNOSIS — Z79899 Other long term (current) drug therapy: Secondary | ICD-10-CM | POA: Insufficient documentation

## 2019-06-06 LAB — SALICYLATE LEVEL: Salicylate Lvl: <7 mg/dL (ref 2.8–30.0)

## 2019-06-06 LAB — CBC
HCT: 47.3 % (ref 39.0–52.0)
Hemoglobin: 17 g/dL (ref 13.0–17.0)
MCH: 31.7 pg (ref 26.0–34.0)
MCHC: 35.9 g/dL (ref 30.0–36.0)
MCV: 88.1 fL (ref 80.0–100.0)
Platelets: 327 10*3/uL (ref 150–400)
RBC: 5.37 MIL/uL (ref 4.22–5.81)
RDW: 12.3 % (ref 11.5–15.5)
WBC: 21.8 10*3/uL — ABNORMAL HIGH (ref 4.0–10.5)
nRBC: 0 % (ref 0.0–0.2)

## 2019-06-06 LAB — BASIC METABOLIC PANEL
Anion gap: 17 — ABNORMAL HIGH (ref 5–15)
BUN: 12 mg/dL (ref 6–20)
CO2: 23 mmol/L (ref 22–32)
Calcium: 9.9 mg/dL (ref 8.9–10.3)
Chloride: 97 mmol/L — ABNORMAL LOW (ref 98–111)
Creatinine, Ser: 0.92 mg/dL (ref 0.61–1.24)
GFR calc Af Amer: >60 mL/min (ref >60)
GFR calc non Af Amer: >60 mL/min (ref >60)
Glucose, Bld: 114 mg/dL — ABNORMAL HIGH (ref 70–99)
Potassium: 3.6 mmol/L (ref 3.5–5.1)
Sodium: 137 mmol/L (ref 135–145)

## 2019-06-06 LAB — LACTIC ACID, PLASMA: Lactic Acid, Venous: 1.4 mmol/L (ref 0.5–1.9)

## 2019-06-06 MED ORDER — HYDROMORPHONE HCL 1 MG/ML IJ SOLN
1.0000 mg | Freq: Once | INTRAMUSCULAR | Status: AC
  Administered 2019-06-06: 1 mg via INTRAVENOUS
  Filled 2019-06-06: qty 1

## 2019-06-06 MED ORDER — SODIUM CHLORIDE 0.9 % IV BOLUS
1000.0000 mL | Freq: Once | INTRAVENOUS | Status: AC
  Administered 2019-06-06: 1000 mL via INTRAVENOUS

## 2019-06-06 MED ORDER — SODIUM CHLORIDE 0.9 % IV SOLN
3.0000 g | Freq: Once | INTRAVENOUS | Status: AC
  Administered 2019-06-06: 3 g via INTRAVENOUS
  Filled 2019-06-06: qty 8

## 2019-06-06 MED ORDER — IOHEXOL 300 MG/ML  SOLN
75.0000 mL | Freq: Once | INTRAMUSCULAR | Status: AC | PRN
  Administered 2019-06-06: 75 mL via INTRAVENOUS
  Filled 2019-06-06: qty 75

## 2019-06-06 MED ORDER — ONDANSETRON HCL 4 MG/2ML IJ SOLN
4.0000 mg | Freq: Once | INTRAMUSCULAR | Status: AC
  Administered 2019-06-06: 4 mg via INTRAVENOUS
  Filled 2019-06-06: qty 2

## 2019-06-06 NOTE — ED Provider Notes (Signed)
[facility] EMERGENCY DEPARTMENT Provider Note   CSN: [id number] Arrival date & time: 06/06/19  2012     History Chief Complaint  Patient presents with  . Dental Pain    Joshua Riggs is a 23 y.o. male presents today for evaluation of right-sided facial pain, swelling.  Patient states yesterday he developed dental pain, facial swelling that has increased significantly from yesterday still today.  He has been taking Tylenol with no relief.  Pain is 10 out of 10.  Denies any purulent drainage intraorally.  Admits to dental decay right lower molars.  He has been able to tolerate p.o. well.  Does have slight pain with opening the jaw.  He denies any other symptoms.   After thorough discussion with patient he admits to taking 1 to 2 tablets of either aspirin or Tylenol every 2-3 hours as needed.  He also admits to have drinking 1/5 of whiskey in the last 2 days.  Typically does not drink alcohol, was only consuming alcohol for pain relief.  HPI     Past Medical History:  Diagnosis Date  . Asthma   . Pinched nerve    back    There are no problems to display for this patient.   Past Surgical History:  Procedure Laterality Date  . SKIN GRAFT         No family history on file.  Social History   Tobacco Use  . Smoking status: Current Every Day Smoker    Packs/day: 1.00    Types: Cigarettes  . Smokeless tobacco: Never Used  Substance Use Topics  . Alcohol use: Yes    Comment: occasionally  . Drug use: No    Home Medications Prior to Admission medications   Medication Sig Start Date End Date Taking? Authorizing Provider  amoxicillin (AMOXIL) 500 MG capsule Take 1 capsule (500 mg total) by mouth 3 (three) times daily. 10/01/17   Fisher, Roselyn Bering, PA-C  amoxicillin-clavulanate (AUGMENTIN) 875-125 MG tablet Take 1 tablet by mouth every 12 (twelve) hours for 10 days. 06/07/19 06/17/19  Evon Slack, PA-C  benzonatate (TESSALON) 200 MG capsule Take  1 capsule (200 mg total) by mouth 3 (three) times daily as needed for cough. 10/01/17   Fisher, Roselyn Bering, PA-C  cyclobenzaprine (FLEXERIL) 10 MG tablet Take 1 tablet (10 mg total) by mouth 3 (three) times daily as needed. 05/16/19   Triplett, Rulon Eisenmenger B, FNP  naproxen (NAPROSYN) 500 MG tablet Take 1 tablet (500 mg total) by mouth 2 (two) times daily with a meal. 05/16/19   Triplett, Cari B, FNP  oxyCODONE-acetaminophen (PERCOCET) 5-325 MG tablet Take 1 tablet by mouth every 4 (four) hours as needed for severe pain. 06/07/19 06/06/20  Evon Slack, PA-C    Allergies    Patient has no known allergies.  Review of Systems   Review of Systems  Constitutional: Positive for fever.  HENT: Negative for sinus pain, sore throat and trouble swallowing.   Respiratory: Negative for shortness of breath.   Cardiovascular: Negative for chest pain.  Gastrointestinal: Negative for nausea and vomiting.  Skin: Negative for wound.    Physical Exam Updated Vital Signs BP (!) 141/91 (BP Location: Right Arm)   Pulse (!) 114   Temp 99 F (37.2 C) (Oral)   Resp 19   SpO2 92%   Physical Exam Constitutional:      Appearance: He is well-developed.  HENT:     Head: Normocephalic.     Comments:  Right-sided facial swelling, right maxillary sinus all the way to the submandibular region.  No warmth or redness but moderate external tenderness.  No intraoral drainage.  No sublingual abnormalities.  Pharynx is normal no peritonsillar abscess. Eyes:     Conjunctiva/sclera: Conjunctivae normal.  Cardiovascular:     Rate and Rhythm: Normal rate.  Pulmonary:     Effort: Pulmonary effort is normal. No respiratory distress.  Musculoskeletal:        General: Normal range of motion.     Cervical back: Normal range of motion.  Skin:    General: Skin is warm.     Findings: No rash.  Neurological:     Mental Status: He is alert and oriented to person, place, and time.  Psychiatric:        Behavior: Behavior normal.         Thought Content: Thought content normal.     ED Results / Procedures / Treatments   Labs (all labs ordered are listed, but only abnormal results are displayed) Labs Reviewed  CBC - Abnormal; Notable for the following components:      Result Value   WBC 21.8 (*)    All other components within normal limits  BASIC METABOLIC PANEL - Abnormal; Notable for the following components:   Chloride 97 (*)    Glucose, Bld 114 (*)    Anion gap 17 (*)    All other components within normal limits  HEPATIC FUNCTION PANEL - Abnormal; Notable for the following components:   Total Bilirubin 1.4 (*)    Bilirubin, Direct 0.3 (*)    Indirect Bilirubin 1.1 (*)    All other components within normal limits  CULTURE, BLOOD (SINGLE)  LACTIC ACID, PLASMA  SALICYLATE LEVEL  LACTIC ACID, PLASMA    EKG None  Radiology CT Maxillofacial W Contrast  Result Date: 06/06/2019 CLINICAL DATA:  Initial evaluation for acute right facial pain and swelling. EXAM: CT MAXILLOFACIAL WITH CONTRAST TECHNIQUE: Multidetector CT imaging of the maxillofacial structures was performed with intravenous contrast. Multiplanar CT image reconstructions were also generated. CONTRAST:  87mL OMNIPAQUE IOHEXOL 300 MG/ML  SOLN COMPARISON:  None available. FINDINGS: Osseous: No acute osseous abnormality seen within the face. No discrete osseous lesions. Orbits: Globes and orbital soft tissues within normal limits. No evidence for intraorbital or postseptal cellulitis. Sinuses: Mild to moderate mucosal thickening seen about the right maxillary sinus. More mild mucoperiosteal thickening seen about the ethmoidal air cells and right sphenoid sinus. Paranasal sinuses are otherwise clear. Mastoid air cells and middle ear cavities are well pneumatized and free of fluid. Soft tissues: Extensive/issue swelling with inflammatory stranding seen involving the right face, primarily involving the right masticator, parotid, and submandibular spaces, with  mild extension into the right parapharyngeal space. Findings consistent with acute infection/cellulitis. innumerable dental caries are present, most prominent of which seen at the right first mandibular molar. Findings suggest an odontogenic origin. No discrete abscess or drainable fluid collection seen at this time. Prominent right level I, IB, and II lymph nodes, likely reactive. Limited intracranial: Unremarkable. IMPRESSION: 1. Extensive swelling and inflammatory stranding throughout the right face as above, consistent with acute infection/cellulitis. Innumerable dental caries are present, suggesting an odontogenic origin. No discrete abscess or other drainable fluid collection identified. 2. Associated and/or concomitant mild right maxillary sinus disease. Electronically Signed   By: Jeannine Boga M.D.   On: 06/06/2019 23:58    Procedures Procedures (including critical care time)  Medications Ordered in ED Medications  HYDROmorphone (DILAUDID)  injection 1 mg (1 mg Intravenous Given 06/06/19 2155)  sodium chloride 0.9 % bolus 1,000 mL (0 mLs Intravenous Stopped 06/06/19 2318)  ondansetron (ZOFRAN) injection 4 mg (4 mg Intravenous Given 06/06/19 2153)  Ampicillin-Sulbactam (UNASYN) 3 g in sodium chloride 0.9 % 100 mL IVPB (3 g Intravenous New Bag/Given 06/06/19 2317)  iohexol (OMNIPAQUE) 300 MG/ML solution 75 mL (75 mLs Intravenous Contrast Given 06/06/19 2249)  sodium chloride 0.9 % bolus 1,000 mL (1,000 mLs Intravenous New Bag/Given 06/06/19 2314)    ED Course  I have reviewed the triage vital signs and the nursing notes.  Pertinent labs & imaging results that were available during my care of the patient were reviewed by me and considered in my medical decision making (see chart for details).    MDM Rules/Calculators/A&P                      23 year old male with 1 day history of right-sided facial swelling, dental pain.  Has numerous dental caries.  Patient with significant  leukocytosis of 21.8, CT showed no abscess formation, only extensive swelling.  Patient tolerating p.o. well.  Vital signs stable after IV fluids.  Heart rate down to 108.  Patient with no chest pain or shortness of breath.  Due to significant swelling, elevated white count I recommended patient stay in hospital for continued monitoring, fluids and IV antibiotics.  Patient absolutely refusing staying in the hospital despite understanding possible consequences.  Patient is given instructions on signs and symptoms to return to the ER immediately for such as any fevers above 101, increasing pain, increasing swelling, inability to tolerate p.o. fluids as well as no improvement of facial swelling in 24 hours.  Patient's given prescription for Augmentin and oxycodone.  He will discontinue Tylenol/aspirin/alcohol for pain control.   Final Clinical Impression(s) / ED Diagnoses Final diagnoses:  Dental abscess  Facial swelling    Rx / DC Orders ED Discharge Orders         Ordered    amoxicillin-clavulanate (AUGMENTIN) 875-125 MG tablet  Every 12 hours     06/07/19 0031    oxyCODONE-acetaminophen (PERCOCET) 5-325 MG tablet  Every 4 hours PRN     06/07/19 0031           Evon SlackGaines, Imanol Bihl C, PA-C 06/07/19 0041    Phineas SemenGoodman, Graydon, MD 06/09/19 1501

## 2019-06-06 NOTE — ED Notes (Signed)
Called with no response x2.

## 2019-06-06 NOTE — ED Notes (Signed)
Called with no response

## 2019-06-06 NOTE — ED Notes (Signed)
Called x3 with no response 

## 2019-06-07 LAB — HEPATIC FUNCTION PANEL
ALT: 26 U/L (ref 0–44)
AST: 27 U/L (ref 15–41)
Albumin: 4.3 g/dL (ref 3.5–5.0)
Alkaline Phosphatase: 84 U/L (ref 38–126)
Bilirubin, Direct: 0.3 mg/dL — ABNORMAL HIGH (ref 0.0–0.2)
Indirect Bilirubin: 1.1 mg/dL — ABNORMAL HIGH (ref 0.3–0.9)
Total Bilirubin: 1.4 mg/dL — ABNORMAL HIGH (ref 0.3–1.2)
Total Protein: 7.3 g/dL (ref 6.5–8.1)

## 2019-06-07 MED ORDER — OXYCODONE-ACETAMINOPHEN 5-325 MG PO TABS: 1.0000 | ORAL_TABLET | ORAL | 0 refills | Status: AC | PRN

## 2019-06-07 MED ORDER — AMOXICILLIN-POT CLAVULANATE 875-125 MG PO TABS: 1.0000 | ORAL_TABLET | Freq: Two times a day (BID) | ORAL | 0 refills | Status: AC

## 2019-06-07 NOTE — Discharge Instructions (Addendum)
Please return to the ER immediately for any increasing pain or swelling, difficulty swallowing, fevers above 101, worsening symptoms or urgent changes in health.

## 2019-06-11 LAB — CULTURE, BLOOD (SINGLE)
Culture: NO GROWTH
Special Requests: ADEQUATE

## 2020-08-08 ENCOUNTER — Other Ambulatory Visit: Payer: Self-pay

## 2020-08-08 ENCOUNTER — Emergency Department
Admission: EM | Admit: 2020-08-08 | Discharge: 2020-08-08 | Disposition: A | Discharge status: Home / Self Care | Payer: Self-pay | Attending: Student in an Organized Health Care Education/Training Program | Admitting: Student in an Organized Health Care Education/Training Program

## 2020-08-08 DIAGNOSIS — F17210 Nicotine dependence, cigarettes, uncomplicated: Secondary | ICD-10-CM | POA: Insufficient documentation

## 2020-08-08 DIAGNOSIS — K047 Periapical abscess without sinus: Secondary | ICD-10-CM | POA: Insufficient documentation

## 2020-08-08 DIAGNOSIS — J45909 Unspecified asthma, uncomplicated: Secondary | ICD-10-CM | POA: Insufficient documentation

## 2020-08-08 MED ORDER — AMOXICILLIN-POT CLAVULANATE 875-125 MG PO TABS
1.0000 | ORAL_TABLET | Freq: Once | ORAL | Status: AC
  Administered 2020-08-08: 1 via ORAL
  Filled 2020-08-08: qty 1

## 2020-08-08 MED ORDER — IBUPROFEN 800 MG PO TABS
800.0000 mg | ORAL_TABLET | Freq: Once | ORAL | Status: AC
  Administered 2020-08-08: 800 mg via ORAL
  Filled 2020-08-08: qty 1

## 2020-08-08 MED ORDER — AMOXICILLIN-POT CLAVULANATE 875-125 MG PO TABS: 1.0000 | ORAL_TABLET | Freq: Two times a day (BID) | ORAL | 0 refills | Status: AC

## 2020-08-08 MED ORDER — LIDOCAINE VISCOUS HCL 2 % MT SOLN
15.0000 mL | Freq: Once | OROMUCOSAL | Status: AC
  Administered 2020-08-08: 15 mL via OROMUCOSAL
  Filled 2020-08-08: qty 15

## 2020-08-08 MED ORDER — LIDOCAINE VISCOUS HCL 2 % MT SOLN: 15.0000 mL | OROMUCOSAL | 0 refills | Status: AC | PRN

## 2020-08-08 NOTE — ED Notes (Signed)
Patient reports pain and swelling to right lower jaw x 2 days. Patient reports missing teeth x several years. Patient reports dentist appointment scheduled for 3/2, but reports pain was too severe to wait.

## 2020-08-08 NOTE — ED Triage Notes (Signed)
Pt c/o swelling and pain to right lower jaw. Reports infected tooth. Denies fevers.

## 2020-08-08 NOTE — Discharge Instructions (Signed)
OPTIONS FOR DENTAL FOLLOW UP CARE ° °[place] [facility] - Local Safety Net Dental Clinics °http://www.ncdhhs.gov/dph/oralhealth/services/safetynetclinics.htm °  °[facility] (336-562-3123) ° °[facility] [place] ([phone]) ° °[facility] ([phone] ext [phone]) ° °[facility] ([phone]) ° °[facility] ([phone]) °This clinic caters to the indigent population and is on a lottery system. °Location: °[facility], [address], [place] °Clinic Hours: °Wednesdays from 6pm - 9pm, patients seen by a lottery system. °For dates, call or go to [email or web address].edu/[facility]/patients/Dental-[facility] °Services: °Cleanings, fillings and simple extractions. °Payment Options: °DENTAL WORK IS FREE OF CHARGE. Bring proof of income or support. °Best way to get seen: °Arrive at 5:15 pm - this is a lottery, NOT first come/first serve, so arriving earlier will not increase your chances of being seen. °  °  °[facility] °[phone] °Select option 1 for emergencies °  °Location: °[facility], [address], [place] °Clinic Hours: °No walk-ins accepted - call the day before to schedule an appointment. °Check in times are 9:30 am and 1:30 pm. °Services: °Simple extractions, temporary fillings, pulpectomy/pulp debridement, uncomplicated abscess drainage. °Payment Options: °PAYMENT IS DUE AT THE TIME OF SERVICE.  Fee is usually $100-200, additional surgical procedures (e.g. abscess drainage) may be extra. °Cash, checks, Visa/MasterCard accepted.  Can file Medicaid if patient is covered for dental - patient should call case worker to check. °No discount for [facility] patients. °Best way to get seen: °MUST call the day before and get onto the schedule. Can usually be seen the next 1-2 days. No walk-ins accepted. °  °  °[facility] °[phone] °   °Location: °[facility], [address], [place] °Clinic Hours: °M, W, Th, F 8am or 1:30pm, Tues 9a or 1:30 - first come/first served. °Services: °Simple extractions, temporary fillings, uncomplicated abscess drainage.  You do not need to be an [place] resident. °Payment Options: °PAYMENT IS DUE AT THE TIME OF SERVICE. °Dental insurance, otherwise sliding scale - bring proof of income or support. °Depending on income and treatment needed, cost is usually $50-200. °Best way to get seen: °Arrive early as it is first come/first served. °  °  °[facility] °[phone] °  °Location: °[address] °Clinic Hours: °Mon-Thu 8a-5p °Services: °Most basic dental services including extractions and fillings. °Payment Options: °PAYMENT IS DUE AT THE TIME OF SERVICE. °Sliding scale, up to 50% off - bring proof if income or support. °Medicaid with dental option accepted. °Best way to get seen: °Call to schedule an appointment, can usually be seen within 2 weeks OR they will try to see walk-ins - show up at 8a or 2p (you may have to wait). °  °  °[facility] °[phone] °[place] RESIDENTS ONLY °  °Location: °[facility], [address], [place], [place] [address] °Clinic Hours: By appointment only. °Monday - Thursday 8am-5pm, Friday 8am-12pm °Services: Cleanings, fillings, extractions. °Payment Options: °PAYMENT IS DUE AT THE TIME OF SERVICE. °Cash, Visa or MasterCard. Sliding scale - $30 minimum per service. °Best way to get seen: °Come in to office, complete packet and make an appointment - need proof of income °or support monies for each household member and proof of [place] residence. °Usually takes about a month to get in. °  °  °[facility] °[phone] °  °Location: °[address]   [place] °Clinic Hours: Walk-in Urgent Care Dental Services are offered Monday-Friday  mornings only. °The numbers of emergencies accepted daily is limited to the number of °providers available. °Maximum 15 - Mondays, Wednesdays & Thursdays °Maximum 10 - Tuesdays & Fridays °Services: °You do not need to be a [place] resident to be seen for a dental emergency. °Emergencies are defined as pain, swelling, abnormal bleeding, or dental trauma. Walkins will receive x-rays if needed. °NOTE: Dental cleaning is not an emergency. °Payment Options: °PAYMENT IS DUE AT THE TIME OF SERVICE. °Minimum co-pay is $40.00 for uninsured patients. °Minimum co-pay is $3.00 for Medicaid with dental coverage. °Dental Insurance is accepted and must be presented at time of visit. °Medicare does not cover dental. °Forms of payment: Cash, credit card, checks. °Best way to get seen: °If not previously registered with the clinic, walk-in dental registration begins at 7:15 am and is on a first come/first serve basis. °If previously registered with the clinic, call to make an appointment. °  °  °[facility] °[phone] °[place] RESIDENTS ONLY °  °Location: °[address], [place], [place] °Clinic Hours: °Mon-Thu 10a-2p °Services: Extractions only! °Payment Options: °FREE (donations accepted) - bring proof of income or support °Best way to get seen: °Call and schedule an appointment OR come at 8am on the 1st Monday of every month (except for holidays) when it is first come/first served. °  °  °[facility] °[phone] °  °Location: °[address], [place] °Clinic Hours: °Friday mornings °Services, Payment Options, Best way to get seen: °Call for info °

## 2020-08-10 NOTE — ED Provider Notes (Signed)
[facility] Emergency Department Provider Note  ____________________________________________   Event Date/Time   First MD Initiated Contact with Patient 08/08/20 1253     (approximate)  I have reviewed the triage vital signs and the nursing notes.   HISTORY  Chief Complaint Jaw Pain  HPI Joshua Riggs is a 25 y.o. male who presents to the emergency department for evaluation of right lower tooth pain that began while eating a piece of fried chicken 1 to 2 days ago.  Patient reports that since that time, he has had increasing pain of the right lower jaw with associated perceived jaw swelling.  He reports history of dental infections and abscesses.  He called to set up dental appointment for evaluation, but could not be seen until early March.  He denies any fever, facial erythema or other systemic symptoms.         Past Medical History:  Diagnosis Date  . Asthma   . Pinched nerve    back    There are no problems to display for this patient.   Past Surgical History:  Procedure Laterality Date  . SKIN GRAFT      Prior to Admission medications   Medication Sig Start Date End Date Taking? Authorizing Provider  amoxicillin-clavulanate (AUGMENTIN) 875-125 MG tablet Take 1 tablet by mouth 2 (two) times daily for 7 days. 08/08/20 08/15/20 Yes Reianna Batdorf, Ruben Gottron, PA  lidocaine (XYLOCAINE) 2 % solution Use as directed 15 mLs in the mouth or throat as needed for mouth pain. 08/08/20  Yes Lucy Chris, PA  benzonatate (TESSALON) 200 MG capsule Take 1 capsule (200 mg total) by mouth 3 (three) times daily as needed for cough. 10/01/17   Fisher, Roselyn Bering, PA-C  cyclobenzaprine (FLEXERIL) 10 MG tablet Take 1 tablet (10 mg total) by mouth 3 (three) times daily as needed. 05/16/19   Triplett, Rulon Eisenmenger B, FNP  naproxen (NAPROSYN) 500 MG tablet Take 1 tablet (500 mg total) by mouth 2 (two) times daily with a meal. 05/16/19   Triplett, Cari B, FNP    Allergies Patient  has no known allergies.  No family history on file.  Social History Social History   Tobacco Use  . Smoking status: Current Every Day Smoker    Packs/day: 1.00    Types: Cigarettes  . Smokeless tobacco: Never Used  Substance Use Topics  . Alcohol use: Yes    Comment: occasionally  . Drug use: No    Review of Systems  Constitutional: No fever/chills Eyes: No visual changes. ENT: + Dental pain, no sore throat. Cardiovascular: Denies chest pain. Respiratory: Denies shortness of breath. Gastrointestinal: No abdominal pain.  No nausea, no vomiting.  No diarrhea.  No constipation. Genitourinary: Negative for dysuria. Musculoskeletal: Negative for back pain. Skin: Negative for rash. Neurological: Negative for headaches, focal weakness or numbness.   ____________________________________________   PHYSICAL EXAM:  VITAL SIGNS: ED Triage Vitals  Enc Vitals Group     BP 08/08/20 1157 (!) 150/101     Pulse Rate 08/08/20 1157 (!) 119     Resp 08/08/20 1157 16     Temp 08/08/20 1157 97.6 F (36.4 C)     Temp Source 08/08/20 1347 Oral     SpO2 08/08/20 1157 99 %     Weight 08/08/20 1317 165 lb 5.5 oz (75 kg)     Height 08/08/20 1317 5\' 9"  (1.753 m)     Head Circumference --      Peak Flow --  Pain Score 08/08/20 1347 0     Pain Loc --      Pain Edu? --      Excl. in GC? --    Constitutional: Alert and oriented. Well appearing and in no acute distress. Eyes: Conjunctivae are normal. PERRL. EOMI. Head: Atraumatic. Nose: No congestion/rhinnorhea. Mouth/Throat: Mucous membranes are moist.  Extensive number of cavity lesions and broken teeth throughout the mouth.  On the right lower jaw, there is one broken tooth that is the site of significant pain for the patient.  Male with surrounding swelling of the gumline, no evidence of periapical abscess.  No surrounding facial swelling, though there is tenderness to palpation of the external right lower jaw. Neck: No stridor.    Lymphatic: No cervical lymphadenopathy Cardiovascular: Normal rate, regular rhythm. Grossly normal heart sounds.  Good peripheral circulation. Respiratory: Normal respiratory effort.  No retractions. Lungs CTAB. Neurologic:  Normal speech and language. No gross focal neurologic deficits are appreciated. No gait instability. Skin:  Skin is warm, dry and intact. No rash noted. Psychiatric: Mood and affect are normal. Speech and behavior are normal.   ____________________________________________   INITIAL IMPRESSION / ASSESSMENT AND PLAN / ED COURSE  As part of my medical decision making, I reviewed the following data within the electronic MEDICAL RECORD NUMBER Nursing notes reviewed and incorporated and Notes from prior ED visits        Patient is a 25 year old male who presents to the emergency department for evaluation of dental pain.  Patient has noted broken tooth as site of pain on right lower jawline.  He denies fevers and does not have any other systemic symptoms.  No overlying facial erythema or swelling suggestive of cellulitis.  Will initiate antibiotics, anti-inflammatory and provide topical lidocaine solution for the patient.  Advise close dental follow-up, which he is amenable with.  Advised to return to the emergency department with any acute worsening.  Patient stable at time for outpatient therapy.      ____________________________________________   FINAL CLINICAL IMPRESSION(S) / ED DIAGNOSES  Final diagnoses:  Dental infection     ED Discharge Orders         Ordered    amoxicillin-clavulanate (AUGMENTIN) 875-125 MG tablet  2 times daily        08/08/20 1418    lidocaine (XYLOCAINE) 2 % solution  As needed        08/08/20 1418          *Please note:  Joshua Riggs was evaluated in Emergency Department on 08/10/2020 for the symptoms described in the history of present illness. He was evaluated in the context of the global COVID-19 pandemic, which necessitated  consideration that the patient might be at risk for infection with the SARS-CoV-2 virus that causes COVID-19. Institutional protocols and algorithms that pertain to the evaluation of patients at risk for COVID-19 are in a state of rapid change based on information released by regulatory bodies including the CDC and federal and state organizations. These policies and algorithms were followed during the patient's care in the ED.  Some ED evaluations and interventions may be delayed as a result of limited staffing during and the pandemic.*   Note:  This document was prepared using Dragon voice recognition software and may include unintentional dictation errors.   Lucy Chris, PA 08/10/20 1518    Willy Eddy, MD 08/10/20 [phone]

## 2020-09-23 ENCOUNTER — Telehealth: Payer: Self-pay

## 2020-09-23 NOTE — Telephone Encounter (Signed)
After receiving an ER referral, tried to call pt. Unable to LVM.   MD 09/23/20 @ 2:51 pm

## 2021-03-20 ENCOUNTER — Other Ambulatory Visit: Payer: Self-pay

## 2021-03-20 ENCOUNTER — Emergency Department
Admission: EM | Admit: 2021-03-20 | Discharge: 2021-03-20 | Disposition: A | Discharge status: Home / Self Care | Payer: Self-pay | Attending: Emergency Medicine | Admitting: Emergency Medicine

## 2021-03-20 ENCOUNTER — Encounter: Payer: Self-pay | Admitting: Emergency Medicine

## 2021-03-20 DIAGNOSIS — J45909 Unspecified asthma, uncomplicated: Secondary | ICD-10-CM | POA: Insufficient documentation

## 2021-03-20 DIAGNOSIS — Z20822 Contact with and (suspected) exposure to covid-19: Secondary | ICD-10-CM

## 2021-03-20 DIAGNOSIS — U071 COVID-19: Secondary | ICD-10-CM | POA: Insufficient documentation

## 2021-03-20 DIAGNOSIS — F17210 Nicotine dependence, cigarettes, uncomplicated: Secondary | ICD-10-CM | POA: Insufficient documentation

## 2021-03-20 LAB — RESP PANEL BY RT-PCR (FLU A&B, COVID) ARPGX2
Influenza A by PCR: NEGATIVE
Influenza B by PCR: NEGATIVE
SARS Coronavirus 2 by RT PCR: POSITIVE — AB

## 2021-03-20 NOTE — Discharge Instructions (Addendum)
You are being tested for COVID and presumed to be positive based on your symptoms.  Remain out of work for 5 days until your symptoms resolve.  Return to the ED if symptoms worsen.

## 2021-03-20 NOTE — ED Provider Notes (Signed)
[facility] Emergency Department Provider Note ____________________________________________  Time seen: 36  I have reviewed the triage vital signs and the nursing notes.  HISTORY  Chief Complaint  Medical Clearance  HPI Joshua Riggs is a 25 y.o. male presents to the ED for evaluation of symptoms consistent with COVID.  Patient also reports that his employer is requesting a work note to allow him to return to work.  Patient reports that his dad and grandfather both have been diagnosed with COVID and they are his household contacts.  Patient began to have symptoms on Friday, and today reports he is nearly symptom-free.  He has not been tested for COVID and is not performed home test.  Past Medical History:  Diagnosis Date   Asthma    Pinched nerve    back    There are no problems to display for this patient.   Past Surgical History:  Procedure Laterality Date   SKIN GRAFT      Prior to Admission medications   Medication Sig Start Date End Date Taking? Authorizing Provider  benzonatate (TESSALON) 200 MG capsule Take 1 capsule (200 mg total) by mouth 3 (three) times daily as needed for cough. 10/01/17   Fisher, Roselyn Bering, PA-C  cyclobenzaprine (FLEXERIL) 10 MG tablet Take 1 tablet (10 mg total) by mouth 3 (three) times daily as needed. 05/16/19   Triplett, Cari B, FNP  lidocaine (XYLOCAINE) 2 % solution Use as directed 15 mLs in the mouth or throat as needed for mouth pain. 08/08/20   Lucy Chris, PA  naproxen (NAPROSYN) 500 MG tablet Take 1 tablet (500 mg total) by mouth 2 (two) times daily with a meal. 05/16/19   Triplett, Cari B, FNP    Allergies Patient has no known allergies.  No family history on file.  Social History Social History   Tobacco Use   Smoking status: Every Day    Packs/day: 1.00    Types: Cigarettes   Smokeless tobacco: Never  Substance Use Topics   Alcohol use: Yes    Comment: occasionally   Drug use: No    Review  of Systems  Constitutional: Negative for fever. Eyes: Negative for visual changes. ENT: Negative for sore throat. Cardiovascular: Negative for chest pain. Respiratory: Negative for shortness of breath. Gastrointestinal: Negative for abdominal pain, vomiting and diarrhea. Genitourinary: Negative for dysuria. Musculoskeletal: Negative for back pain. Skin: Negative for rash. Neurological: Negative for headaches, focal weakness or numbness. ____________________________________________  PHYSICAL EXAM:  VITAL SIGNS: ED Triage Vitals  Enc Vitals Group     BP 03/20/21 1818 (!) 155/92     Pulse Rate 03/20/21 1818 90     Resp 03/20/21 1818 18     Temp 03/20/21 1818 98.3 F (36.8 C)     Temp Source 03/20/21 1818 Oral     SpO2 03/20/21 1818 97 %     Weight 03/20/21 1816 170 lb (77.1 kg)     Height 03/20/21 1816 5\' 9"  (1.753 m)     Head Circumference --      Peak Flow --      Pain Score 03/20/21 1816 0     Pain Loc --      Pain Edu? --      Excl. in [place]? --     Constitutional: Alert and oriented. Well appearing and in no distress. Head: Normocephalic and atraumatic. Eyes: Conjunctivae are normal. Normal extraocular movements Cardiovascular: Normal rate, regular rhythm. Normal distal pulses. Respiratory: Normal respiratory  effort. No wheezes/rales/rhonchi. Gastrointestinal: Soft and nontender. No distention. Musculoskeletal: Nontender with normal range of motion in all extremities.  Neurologic:  Normal gait without ataxia. Normal speech and language. No gross focal neurologic deficits are appreciated. Skin:  Skin is warm, dry and intact. No rash noted. Psychiatric: Mood and affect are normal. Patient exhibits appropriate insight and judgment. ____________________________________________    {LABS (pertinent positives/negatives)  Labs Reviewed  RESP PANEL BY RT-PCR (FLU A&B, COVID) ARPGX2   ___________________________________________  {EKG  ____________________________________________   RADIOLOGY Official radiology report(s): No results found. ____________________________________________  PROCEDURES   Procedures ____________________________________________   INITIAL IMPRESSION / ASSESSMENT AND PLAN / ED COURSE  As part of my medical decision making, I reviewed the following data within the electronic MEDICAL RECORD NUMBER Labs reviewed pending and Notes from prior ED visits   Patient ED evaluation and request for work note.  Patient with clinical diagnosis of COVID, presenting for screening testing.  Patient will be tested for COVID, but would also be held out of work for a total of 5 days from symptom onset as is appropriate for his clinical COVID at this peer he will follow with primary provider return to the ED if necessary.  Viral testing is pending.  Joshua Riggs was evaluated in Emergency Department on 03/20/2021 for the symptoms described in the history of present illness. He was evaluated in the context of the global COVID-19 pandemic, which necessitated consideration that the patient might be at risk for infection with the SARS-CoV-2 virus that causes COVID-19. Institutional protocols and algorithms that pertain to the evaluation of patients at risk for COVID-19 are in a state of rapid change based on information released by regulatory bodies including the CDC and federal and state organizations. These policies and algorithms were followed during the patient's care in the ED. ____________________________________________  FINAL CLINICAL IMPRESSION(S) / ED DIAGNOSES  Final diagnoses:  Clinical diagnosis of COVID-19  Close exposure to COVID-19 virus      Lissa Hoard, PA-C 03/20/21 2003    Phineas Semen, MD 03/20/21 2043

## 2021-03-20 NOTE — ED Triage Notes (Signed)
Pt reports needs a covid test for his job and a work note because his dad has covid. Pt denies any symptoms and states his job is asking for a test and a doctors note.

## 2021-03-20 NOTE — ED Notes (Signed)
Pt verbally aggressive with charge and Diplomatic Services operational officer over phone.

## 2021-08-31 ENCOUNTER — Other Ambulatory Visit: Payer: Self-pay

## 2021-08-31 ENCOUNTER — Emergency Department
Admission: EM | Admit: 2021-08-31 | Discharge: 2021-08-31 | Disposition: A | Discharge status: Home / Self Care | Payer: Self-pay | Attending: Emergency Medicine | Admitting: Emergency Medicine

## 2021-08-31 DIAGNOSIS — J340 Abscess, furuncle and carbuncle of nose: Secondary | ICD-10-CM

## 2021-08-31 DIAGNOSIS — J45909 Unspecified asthma, uncomplicated: Secondary | ICD-10-CM | POA: Insufficient documentation

## 2021-08-31 DIAGNOSIS — L03211 Cellulitis of face: Secondary | ICD-10-CM | POA: Insufficient documentation

## 2021-08-31 MED ORDER — MUPIROCIN CALCIUM 2 % EX CREA: TOPICAL_CREAM | CUTANEOUS | 0 refills | Status: AC

## 2021-08-31 MED ORDER — DOXYCYCLINE HYCLATE 100 MG PO CAPS: 100.0000 mg | ORAL_CAPSULE | Freq: Two times a day (BID) | ORAL | 0 refills | Status: DC

## 2021-08-31 MED ORDER — NAPROXEN 500 MG PO TABS: 500.0000 mg | ORAL_TABLET | Freq: Two times a day (BID) | ORAL | 0 refills | Status: AC

## 2021-08-31 NOTE — ED Provider Notes (Signed)
? ?[facility] ?Provider Note ? ? ? Event Date/Time  ? First MD Initiated Contact with Patient 08/31/21 1749   ?  (approximate) ? ? ?History  ? ?Facial Swelling ? ? ?HPI ? ?Joshua Riggs is a 26 y.o. male  with history of asthma and as listed in EMR presents to the emergency department for treatment and evaluation of swelling and pain to his nose after squeezing a pimple 5 days ago. No fever. No history of skin infection. ? ?  ? ? ?Physical Exam  ? ?Triage Vital Signs: ?ED Triage Vitals  ?Enc Vitals Group  ?   BP 08/31/21 1619 (!) 148/98  ?   Pulse Rate 08/31/21 1619 88  ?   Resp 08/31/21 1619 18  ?   Temp 08/31/21 1619 98.2 ?F (36.8 ?C)  ?   Temp Source 08/31/21 1619 Oral  ?   SpO2 08/31/21 1619 97 %  ?   Weight --   ?   Height --   ?   Head Circumference --   ?   Peak Flow --   ?   Pain Score 08/31/21 1619 10  ?   Pain Loc --   ?   Pain Edu? --   ?   Excl. in [place]? --   ? ? ?Most recent vital signs: ?Vitals:  ? 08/31/21 1619  ?BP: (!) 148/98  ?Pulse: 88  ?Resp: 18  ?Temp: 98.2 ?F (36.8 ?C)  ?SpO2: 97%  ? ? ?General: Awake, no distress.  ?CV:  Good peripheral perfusion.  ?Resp:  Normal effort.  ?Abd:  No distention.  ?Other:  Tip of nose erythematous, edematous with pinpoint area of purulence under a small scab. ? ? ?ED Results / Procedures / Treatments  ? ?Labs ?(all labs ordered are listed, but only abnormal results are displayed) ?Labs Reviewed - No data to display ? ? ?EKG ? ? ? ? ?RADIOLOGY ? ?Not indicated ? ? ? ?PROCEDURES: ? ?Critical Care performed: No ? ?Procedures ? ? ?MEDICATIONS ORDERED IN ED: ?Medications - No data to display ? ? ?IMPRESSION / MDM / ASSESSMENT AND PLAN / ED COURSE  ? ?I have reviewed the triage note. ? ?Differential diagnosis includes, but is not limited to, abscess, cellulitis ? ?26 year old male presenting to the emergency department for treatment and evaluation of pain and swelling to his nose.  See HPI for further details. ? ?On exam, he had a small area of  purulence under a scab.  Scab was lifted with an 18-gauge needle and then gently expressed moderate amount of purulent drainage.  Plan will be to prescribe doxycycline and mupirocin and have him take Naprosyn.  He was encouraged to apply warm compress off-and-on throughout today and tomorrow.  He is to follow-up with primary care if not improving over the next 2 to 3 days.  If symptoms change or worsen and he is unable to schedule appointment he was encouraged to return to the emergency department. ? ?  ? ? ?FINAL CLINICAL IMPRESSION(S) / ED DIAGNOSES  ? ?Final diagnoses:  ?Cellulitis of external nose  ? ? ? ?Rx / DC Orders  ? ?ED Discharge Orders   ? ?      Ordered  ?  doxycycline (VIBRAMYCIN) 100 MG capsule  2 times daily       ? 08/31/21 1829  ?  mupirocin cream (BACTROBAN) 2 %       ? 08/31/21 1829  ?  naproxen (NAPROSYN) 500 MG  tablet  2 times daily with meals       ? 08/31/21 1832  ? ?  ?  ? ?  ? ? ? ?Note:  This document was prepared using Dragon voice recognition software and may include unintentional dictation errors. ?  ?Victorino Dike, FNP ?08/31/21 1839 ? ?  ?Naaman Plummer, MD ?09/01/21 1501 ? ?

## 2021-08-31 NOTE — Discharge Instructions (Signed)
Please use a warm compress off and on tonight and tomorrow. ?Take the antibiotic until finished. ?See primary care or return to the ER if not improving over the next couple of days or sooner if symptoms worsen. ?

## 2021-08-31 NOTE — ED Triage Notes (Signed)
Pt comes with c/o redness and swelling to nose. Pt states he had zit on nose on Friday and popped it. Pt states it has now gotten worse and inflammed.  ?

## 2022-04-06 ENCOUNTER — Emergency Department
Admission: EM | Admit: 2022-04-06 | Discharge: 2022-04-06 | Disposition: A | Discharge status: Home / Self Care | Payer: Self-pay | Attending: Emergency Medicine | Admitting: Emergency Medicine

## 2022-04-06 ENCOUNTER — Other Ambulatory Visit: Payer: Self-pay

## 2022-04-06 ENCOUNTER — Encounter: Payer: Self-pay | Admitting: Emergency Medicine

## 2022-04-06 DIAGNOSIS — K029 Dental caries, unspecified: Secondary | ICD-10-CM | POA: Insufficient documentation

## 2022-04-06 DIAGNOSIS — L02212 Cutaneous abscess of back [any part, except buttock]: Secondary | ICD-10-CM | POA: Insufficient documentation

## 2022-04-06 DIAGNOSIS — K0889 Other specified disorders of teeth and supporting structures: Secondary | ICD-10-CM

## 2022-04-06 DIAGNOSIS — L0291 Cutaneous abscess, unspecified: Secondary | ICD-10-CM

## 2022-04-06 MED ORDER — CHLORHEXIDINE GLUCONATE 4 % EX LIQD: Freq: Every day | CUTANEOUS | 0 refills | Status: AC | PRN

## 2022-04-06 MED ORDER — CHLORHEXIDINE GLUCONATE 0.12 % MT SOLN: 15.0000 mL | Freq: Two times a day (BID) | OROMUCOSAL | 0 refills | Status: AC

## 2022-04-06 MED ORDER — AMOXICILLIN-POT CLAVULANATE 875-125 MG PO TABS: 1.0000 | ORAL_TABLET | Freq: Two times a day (BID) | ORAL | 0 refills | Status: AC

## 2022-04-06 MED ORDER — KETOROLAC TROMETHAMINE 15 MG/ML IJ SOLN
15.0000 mg | Freq: Once | INTRAMUSCULAR | Status: AC
  Administered 2022-04-06: 15 mg via INTRAMUSCULAR
  Filled 2022-04-06: qty 1

## 2022-04-06 MED ORDER — MUPIROCIN 2 % EX OINT: 1.0000 | TOPICAL_OINTMENT | Freq: Two times a day (BID) | CUTANEOUS | 0 refills | Status: AC

## 2022-04-06 NOTE — ED Provider Notes (Signed)
[facility] Provider Note    Event Date/Time   First MD Initiated Contact with Patient 04/06/22 1424     (approximate)   History   Abscess and Dental Pain   HPI  Joshua Riggs is a 26 y.o. male who presents today for evaluation of dental pain and also a possible abscess on his back.  Patient reports that his dental pain has been ongoing for several weeks.  He denies any swelling to his face or to his neck.  He has not had any trouble opening his mouth.  He has not had any fevers or chills.  He also reports that he thinks he was bit by something to his low back, and it has since opened up.  He is wondering how to take care of this area.  There are no problems to display for this patient.         Physical Exam   Triage Vital Signs: ED Triage Vitals  Enc Vitals Group     BP 04/06/22 1320 134/86     Pulse --      Resp 04/06/22 1320 18     Temp 04/06/22 1320 98.2 F (36.8 C)     Temp Source 04/06/22 1320 Oral     SpO2 04/06/22 1320 97 %     Weight --      Height --      Head Circumference --      Peak Flow --      Pain Score 04/06/22 1321 10     Pain Loc --      Pain Edu? --      Excl. in [place]? --     Most recent vital signs: Vitals:   04/06/22 1320 04/06/22 1455  BP: 134/86 132/84  Pulse:  88  Resp: 18 18  Temp: 98.2 F (36.8 C) 98 F (36.7 C)  SpO2: 97% 98%    Physical Exam Vitals and nursing note reviewed.  Constitutional:      General: Awake and alert. No acute distress.    Appearance: Normal appearance. The patient is normal weight.  HENT:     Head: Normocephalic and atraumatic.     Mouth: Mucous membranes are moist.  Poor dentition diffusely.  Dental caries noted in right superior molars with Tenderness.  No gingival fluctuance.  No swelling to the face or neck.  No overlying erythema.  No trismus.  No sublingual swelling. Eyes:     General: PERRL. Normal EOMs        Right eye: No discharge.        Left eye: No discharge.      Conjunctiva/sclera: Conjunctivae normal.  Cardiovascular:     Rate and Rhythm: Normal rate and regular rhythm.     Pulses: Normal pulses.  Pulmonary:     Effort: Pulmonary effort is normal. No respiratory distress.  Abdominal:     Abdomen is soft. There is no abdominal tenderness. Musculoskeletal:        General: No swelling. Normal range of motion.     Cervical back: Normal range of motion and neck supple.  Skin:    General: Skin is warm and dry.     Capillary Refill: Capillary refill takes less than 2 seconds.     Findings: 1 x 1 cm open circular lesion to right low back with half centimeter surrounding erythema with center with mild purulence.  No active drainage.  No continued fluctuance.  No crepitus Neurological:  Mental Status: The patient is awake and alert.      ED Results / Procedures / Treatments   Labs (all labs ordered are listed, but only abnormal results are displayed) Labs Reviewed - No data to display   EKG     RADIOLOGY     PROCEDURES:  Critical Care performed:   Procedures   MEDICATIONS ORDERED IN ED: Medications  ketorolac (TORADOL) 15 MG/ML injection 15 mg (15 mg Intramuscular Given 04/06/22 1451)     IMPRESSION / MDM / ASSESSMENT AND PLAN / ED COURSE  I reviewed the triage vital signs and the nursing notes.   Differential diagnosis includes, but is not limited to, dental caries, gingival abscess, dental decay, pulpitis, abscess, cellulitis.  Patient was evaluated in the emergency department for dental pain and possible abscess.  He is awake and alert, hemodynamically stable and afebrile.  Patient has diffusely poor dentition, I suspect some dental caries vs pulpitits. No gingival swelling or fluctuance concerning for gingival abscess.  No trismus, nuchal rigidity, neck pain, hot potato voice, uvular deviation or malocclusion to suggest deep space infection. No sublingual swelling concerning for Ludwig's angina.  Patient was started  on antibiotics and chlorhexidine mouth rinse.  Patient was treated symptomatically in the emergency department with improvement of his symptoms.  He was advised to follow-up with his dentist as soon as possible, and reports that he plans to do so.  As for the lesion on his back, it is open and draining.  There is no continued fluctuance to suggest further loculations.  There is minimal surrounding erythema.  No pain out of proportion or hemodynamic instability to suggest deep space infection, no crepitus noted.  The antibiotics prescribed for his dental problem is appropriate coverage for his cellulitis as well.  He was also started on Hibiclens for this problem as well as mupirocin given the purulence.  Discussed care plan, return precautions, and advised close outpatient follow-up. Patient agrees with plan of care.   Patient's presentation is most consistent with acute illness / injury with system symptoms.      FINAL CLINICAL IMPRESSION(S) / ED DIAGNOSES   Final diagnoses:  Abscess  Pain, dental     Rx / DC Orders   ED Discharge Orders          Ordered    amoxicillin-clavulanate (AUGMENTIN) 875-125 MG tablet  2 times daily        04/06/22 1437    chlorhexidine (PERIDEX) 0.12 % solution  2 times daily        04/06/22 1437    chlorhexidine (HIBICLENS) 4 % external liquid  Daily PRN        04/06/22 1437    mupirocin ointment (BACTROBAN) 2 %  2 times daily        04/06/22 1437             Note:  This document was prepared using Dragon voice recognition software and may include unintentional dictation errors.   Jackelyn Hoehn, PA-C 04/06/22 1522    Concha Se, MD 04/06/22 [phone]

## 2022-04-06 NOTE — Discharge Instructions (Addendum)
Please follow-up with your dentist as soon as possible.  For your dental pain, take the antibiotic as well as the Peridex oral solution.  For the wound on your back, the antibiotic will also help with this, and you may also apply the ointment as prescribed.  The Hibiclens is a soap that you can use on the area as well.  Please return for any new, worsening, or change in symptoms or other concerns.  It was a pleasure caring for you today.

## 2022-04-06 NOTE — ED Triage Notes (Signed)
Pt presents with right upper dental pain, supposed to have it pulled but didn't have the money.  Pt also has "a bite" or something on his back that he feels has abscessed.  Yellow drainage noted in triage.

## 2022-05-27 ENCOUNTER — Other Ambulatory Visit: Payer: Self-pay

## 2022-05-27 ENCOUNTER — Emergency Department
Admission: EM | Admit: 2022-05-27 | Discharge: 2022-05-27 | Disposition: A | Discharge status: Home / Self Care | Payer: Self-pay | Attending: Student | Admitting: Student

## 2022-05-27 DIAGNOSIS — L03312 Cellulitis of back [any part except buttock]: Secondary | ICD-10-CM | POA: Insufficient documentation

## 2022-05-27 MED ORDER — DOXYCYCLINE MONOHYDRATE 100 MG PO TABS: 100.0000 mg | ORAL_TABLET | Freq: Two times a day (BID) | ORAL | 0 refills | Status: AC

## 2022-05-27 MED ORDER — CEPHALEXIN 500 MG PO CAPS: 500.0000 mg | ORAL_CAPSULE | Freq: Four times a day (QID) | ORAL | 0 refills | Status: AC

## 2022-05-27 NOTE — ED Triage Notes (Signed)
Pt states he had a bump on his back that he popped and then it got infected- pt states he has been seen for it here before and was told to come back if it got worse- pt states he popped the initial bump last week but then it he noticed it got infected yesterday

## 2022-05-27 NOTE — Discharge Instructions (Addendum)
Please apply warm compresses to the area. Take the antibiotics as prescribed. Return for any new, worsening, or change in symptoms or other concerns.

## 2022-05-27 NOTE — ED Provider Notes (Signed)
[facility] Provider Note    None    (approximate)   History   Abscess   HPI  Joshua Riggs is a 26 y.o. male  Pt complains of "boil on back." Reports that it went away, and then came back yesterday. No fevers or chills.  He reports that it feels very hard.  He has not noticed any drainage.  He believes that he had an insect bite      Physical Exam   Triage Vital Signs: ED Triage Vitals [05/27/22 0741]  Enc Vitals Group     BP (!) 128/90     Pulse Rate 98     Resp 18     Temp 98 F (36.7 C)     Temp Source Oral     SpO2 96 %     Weight      Height      Head Circumference      Peak Flow      Pain Score 9     Pain Loc      Pain Edu?      Excl. in [place]?     Most recent vital signs: Vitals:   05/27/22 0741  BP: (!) 128/90  Pulse: 98  Resp: 18  Temp: 98 F (36.7 C)  SpO2: 96%    Physical Exam Vitals and nursing note reviewed.  Constitutional:      General: Awake and alert. No acute distress.    Appearance: Normal appearance. The patient is normal weight.  HENT:     Head: Normocephalic and atraumatic.     Mouth: Mucous membranes are moist.  Eyes:     General: PERRL. Normal EOMs        Right eye: No discharge.        Left eye: No discharge.     Conjunctiva/sclera: Conjunctivae normal.  Cardiovascular:     Rate and Rhythm: Normal rate and regular rhythm.     Pulses: Normal pulses.  Pulmonary:     Effort: Pulmonary effort is normal. No respiratory distress.  Abdominal:     Abdomen is soft. There is no abdominal tenderness. No rebound or guarding. No distention. Musculoskeletal:        General: No swelling. Normal range of motion.     Cervical back: Normal range of motion and neck supple.  Skin:    General: Skin is warm and dry.     Capillary Refill: Capillary refill takes less than 2 seconds.     Findings: Right low back with 2x2cm area of erythema and induration with central clearing. No drainage. Area is indurated without  fluctuance. Tender to palpation Neurological:     Mental Status: The patient is awake and alert.      ED Results / Procedures / Treatments   Labs (all labs ordered are listed, but only abnormal results are displayed) Labs Reviewed - No data to display   EKG     RADIOLOGY     PROCEDURES:  Critical Care performed:   Procedures   MEDICATIONS ORDERED IN ED: Medications - No data to display   IMPRESSION / MDM / [place] / ED COURSE  I reviewed the triage vital signs and the nursing notes.   Differential diagnosis includes, but is not limited to, cellulitis, abscess, folliculitis, carbuncle, furuncle.  Patient is awake and alert, hemodynamically stable and afebrile.  The area is erythematous and raised, though entirely indurated and without any fluctuance.  There is an  central opening, but there is no drainage.  I discussed the option with the patient to attempt incision and drainage, however he prefers to trial antibiotics first which I feel is appropriate given that it is only indurated.  There is no crepitus.  There is no hemodynamic instability to suggest deep space infection.  We discussed return precautions and outpatient follow-up.  I recommended that he apply warm compresses.  He was started on antibiotics.  Patient understands and agrees with plan.  He was discharged in stable condition.   Patient's presentation is most consistent with acute complicated illness / injury requiring diagnostic workup.       FINAL CLINICAL IMPRESSION(S) / ED DIAGNOSES   Final diagnoses:  Cellulitis of back except buttock     Rx / DC Orders   ED Discharge Orders          Ordered    cephALEXin (KEFLEX) 500 MG capsule  4 times daily        05/27/22 0742    doxycycline (ADOXA) 100 MG tablet  2 times daily        05/27/22 6808             Note:  This document was prepared using Dragon voice recognition software and may include unintentional dictation  errors.   Keturah Shavers 05/27/22 8110    Jene Every, MD 05/27/22 [phone]
